# Patient Record
Sex: Female | Born: 1950 | Race: White | Hispanic: No | Marital: Married | Smoking: Former smoker
Health system: Southern US, Community
[De-identification: ages and names within clinical notes are randomized; demographics above are authoritative.]

## PROBLEM LIST (undated history)

## (undated) DIAGNOSIS — R42 Dizziness and giddiness: Secondary | ICD-10-CM

## (undated) DIAGNOSIS — R229 Localized swelling, mass and lump, unspecified: Secondary | ICD-10-CM

## (undated) DIAGNOSIS — I998 Other disorder of circulatory system: Secondary | ICD-10-CM

## (undated) DIAGNOSIS — M25512 Pain in left shoulder: Secondary | ICD-10-CM

## (undated) DIAGNOSIS — W19XXXA Unspecified fall, initial encounter: Secondary | ICD-10-CM

## (undated) DIAGNOSIS — I10 Essential (primary) hypertension: Secondary | ICD-10-CM

## (undated) DIAGNOSIS — M1990 Unspecified osteoarthritis, unspecified site: Secondary | ICD-10-CM

## (undated) DIAGNOSIS — R2689 Other abnormalities of gait and mobility: Secondary | ICD-10-CM

## (undated) DIAGNOSIS — E785 Hyperlipidemia, unspecified: Secondary | ICD-10-CM

## (undated) DIAGNOSIS — M810 Age-related osteoporosis without current pathological fracture: Secondary | ICD-10-CM

## (undated) DIAGNOSIS — K4090 Unilateral inguinal hernia, without obstruction or gangrene, not specified as recurrent: Secondary | ICD-10-CM

## (undated) DIAGNOSIS — H9190 Unspecified hearing loss, unspecified ear: Secondary | ICD-10-CM

## (undated) DIAGNOSIS — K5900 Constipation, unspecified: Secondary | ICD-10-CM

## (undated) DIAGNOSIS — I7300 Raynaud's syndrome without gangrene: Secondary | ICD-10-CM

## (undated) DIAGNOSIS — Z79899 Other long term (current) drug therapy: Secondary | ICD-10-CM

## (undated) DIAGNOSIS — M419 Scoliosis, unspecified: Secondary | ICD-10-CM

## (undated) DIAGNOSIS — G250 Essential tremor: Secondary | ICD-10-CM

## (undated) DIAGNOSIS — R232 Flushing: Secondary | ICD-10-CM

## (undated) DIAGNOSIS — Z8719 Personal history of other diseases of the digestive system: Secondary | ICD-10-CM

## (undated) DIAGNOSIS — Z7189 Other specified counseling: Secondary | ICD-10-CM

## (undated) DIAGNOSIS — Y92009 Unspecified place in unspecified non-institutional (private) residence as the place of occurrence of the external cause: Secondary | ICD-10-CM

## (undated) DIAGNOSIS — R413 Other amnesia: Secondary | ICD-10-CM

## (undated) DIAGNOSIS — M5481 Occipital neuralgia: Secondary | ICD-10-CM

## (undated) DIAGNOSIS — S99912A Unspecified injury of left ankle, initial encounter: Secondary | ICD-10-CM

## (undated) DIAGNOSIS — Z87891 Personal history of nicotine dependence: Secondary | ICD-10-CM

## (undated) DIAGNOSIS — Z0000 Encounter for general adult medical examination without abnormal findings: Secondary | ICD-10-CM

## (undated) DIAGNOSIS — M797 Fibromyalgia: Secondary | ICD-10-CM

## (undated) DIAGNOSIS — R9082 White matter disease, unspecified: Secondary | ICD-10-CM

## (undated) DIAGNOSIS — B029 Zoster without complications: Secondary | ICD-10-CM

## (undated) DIAGNOSIS — J309 Allergic rhinitis, unspecified: Secondary | ICD-10-CM

## (undated) HISTORY — DX: Other amnesia: R41.3

## (undated) HISTORY — DX: Personal history of nicotine dependence: Z87.891

## (undated) HISTORY — PX: SPINE SURGERY: SHX786

## (undated) HISTORY — DX: Essential (primary) hypertension: I10

## (undated) HISTORY — DX: Other long term (current) drug therapy: Z79.899

## (undated) HISTORY — DX: Other abnormalities of gait and mobility: R26.89

## (undated) HISTORY — DX: Unspecified fall, initial encounter: W19.XXXA

## (undated) HISTORY — DX: Other disorder of circulatory system: I99.8

## (undated) HISTORY — DX: Unspecified hearing loss, unspecified ear: H91.90

## (undated) HISTORY — DX: Pain in left shoulder: M25.512

## (undated) HISTORY — DX: Fibromyalgia: M79.7

## (undated) HISTORY — DX: Hyperlipidemia, unspecified: E78.5

## (undated) HISTORY — DX: Raynaud's syndrome without gangrene: I73.00

## (undated) HISTORY — DX: Unspecified osteoarthritis, unspecified site: M19.90

## (undated) HISTORY — DX: Essential tremor: G25.0

## (undated) HISTORY — PX: OTHER SURGICAL HISTORY: SHX169

## (undated) HISTORY — DX: Occipital neuralgia: M54.81

## (undated) HISTORY — DX: Localized swelling, mass and lump, unspecified: R22.9

## (undated) HISTORY — DX: Scoliosis, unspecified: M41.9

## (undated) HISTORY — DX: Other specified counseling: Z71.89

## (undated) HISTORY — DX: Unilateral inguinal hernia, without obstruction or gangrene, not specified as recurrent: K40.90

## (undated) HISTORY — DX: Zoster without complications: B02.9

## (undated) HISTORY — DX: Other disorder of circulatory system: R90.82

## (undated) HISTORY — DX: Allergic rhinitis, unspecified: J30.9

## (undated) HISTORY — DX: Dizziness and giddiness: R42

## (undated) HISTORY — DX: Encounter for general adult medical examination without abnormal findings: Z00.00

## (undated) HISTORY — DX: Constipation, unspecified: K59.00

## (undated) HISTORY — DX: Unspecified injury of left ankle, initial encounter: S99.912A

## (undated) HISTORY — DX: Age-related osteoporosis without current pathological fracture: M81.0

## (undated) HISTORY — PX: UMBILICAL HERNIA REPAIR: SHX196

## (undated) HISTORY — DX: Flushing: R23.2

## (undated) HISTORY — DX: Unspecified fall, initial encounter: Y92.009

## (undated) HISTORY — DX: Personal history of other diseases of the digestive system: Z87.19

---

## 2019-08-24 DIAGNOSIS — I10 Essential (primary) hypertension: Secondary | ICD-10-CM | POA: Diagnosis present

## 2020-10-19 ENCOUNTER — Encounter: Payer: Self-pay | Admitting: Neurology

## 2020-10-30 NOTE — Progress Notes (Signed)
Assessment/Plan:   1.  L sided weakness, cognitive change and falls  -Prior MRI of the brain fairly unremarkable.  Patient clearly has some abnormalities on exam (including L sided neglect), with various movements, including choreiform, tremor, and even some occasional myoclonic movements.  We will go ahead and do an MRI of the brain, this time with contrast.  -DaTscan will be completed.  No evidence of Parkinson's disease, but I do think it is possible that CBGD is in the differential diagnosis.  However, I really would expect her to be a bit more rigid.   -If above neg, we will pursue chorea labs  Subjective:   Karen Nguyen was seen today in the movement disorders clinic for neurologic consultation at the request of Verdell Face, DO.  The consultation is for the evaluation of tremor.  Medical records made available to me have been reviewed. This patient is accompanied in the office by her spouse who supplements the history. Patient has previously seen Dr. Antonietta Barcelona for this and request is for a second opinion per referring physician.  I did have the opportunity to review Dr. Larina Earthly consult in March, 2021.  There is nothing about tremor, but he did make note of gait instability and cramping.  It was felt that her left leaning/gait instability was due to her scoliosis.  Physical therapy was recommended and declined.  In addition, it was felt that she had Planter fasciitis contributing to foot pain.  Regarding her cramping, note stated that "possible underlying inherited muscle condition such as myotonic dystrophy, mitochondrial cytopathy.  We will provide a trial of tizanidine to supplement cyclobenzaprine."  I do not see that a further work-up was completed to assess the conditions in the differential.  She did, however, apparently seen neurology at [facility] in August, 2018 (just for EMG, Dr. Glenice Bow) but I do not have the results of that EMG.  I can see that she went to the appointment, but results  are not within Care Everywhere.  Tremor: Yes.     How long has it been going on?  1 year.  Started after a fall  At rest or with activation?  Both - will note that L leg "jerks" if she is sitting still but hands shake most if using them  When is it noted the most?  Writing, pouring drinks  Fam hx of tremor?  Sister shakes somes  Located where?  Bilateral upper extremities, R>L and L leg jerks  Other Specific Symptoms:  Voice: softer Sleep: sleeps better now with tizanidine but gets cramping in the feet x years  Vivid Dreams:  No.  Acting out dreams:  No. Wet Pillows: No. Postural symptoms:  Yes.    Falls?  Yes.   , Seems to fall to the left.  First fall one year ago - in chair at church and chair flipped and she hit head.  Husband states that he thought that she had a stroke after but told no after scan.  He states that L side started going "bad" after that - trouble with grasp and leaning to the left following that.  Pt fell 2 times 2 weeks ago - sat on stool and fell to the left.  With the other, she went to bend over and get some shoes and just kept going.  Husband states that she falls at least one time per month.  Doesn't use a walker.  Always leans to the left with ambulation Bradykinesia symptoms: difficulty regaining balance ; no  shuffle Loss of smell:  No. Loss of taste:  No. Urinary Incontinence:  No. Difficulty Swallowing:  No. Handwriting, micrographia: cannot answer - "I cannot do it" - b/c its shaky Trouble with ADL's:  Yes.   - b/c of dexterity.  Notes trouble getting her left foot into the shoe, stating that the left leg does not do what she wants it today.  Trouble buttoning clothing: Yes.   Memory changes:  Yes.  ; pt does finances but making mistakes - husband states that he is paying "more attention."  Pt drives.  Pts husband just retired 6 weeks ago to help take care of patient.   Hallucinations:  No.  visual distortions: Yes.   - "I do that a lot" N/V:   No. Lightheaded:  No.  Syncope: No. Diplopia:  No. , but she notes that she will look at something and the image seems to move Dyskinesia:  No.  MRI brain was completed June 24, 2019.  I do not have the films, only the report.  This was reported to show mild atrophy and mild small vessel disease.    ALLERGIES:   Allergies  Allergen Reactions  . Codeine Phosphate [Codeine] Nausea And Vomiting  . Latex Hives  . Sulfur Rash  . Lyrica Cr [Pregabalin Er]   . Oxycodone     CURRENT MEDICATIONS:  Current Outpatient Medications  Medication Instructions  . aspirin 81 MG EC tablet Oral  . Calcium Carb-Cholecalciferol (OS-CAL EXTRA D3) 500-600 MG-UNIT TABS Oral  . ELDERBERRY PO Oral  . losartan (COZAAR) 50 MG tablet 1 tablet, Oral, Daily  . magnesium hydroxide (MILK OF MAGNESIA) 400 MG/5ML suspension Oral  . ondansetron (ZOFRAN) 4 mg, Every 8 hours PRN  . pravastatin (PRAVACHOL) 20 mg, Oral, Daily, Take 1 tablet by mouth every day for cholesterol  . tiZANidine (ZANAFLEX) 2 MG tablet Take 1-2 tabs twice daily as needed for pain, insomnia.  Marland Kitchen venlafaxine XR (EFFEXOR-XR) 37.5 mg, Daily with breakfast    Objective:   PHYSICAL EXAMINATION:    VITALS:   Vitals:   10/31/20 0826  BP: 126/79  Pulse: 74  Resp: 20  SpO2: 98%  Weight: 124 lb (56.2 kg)  Height: 5\' 2"  (1.575 m)    GEN:  The patient appears stated age and is in NAD. HEENT:  Normocephalic, atraumatic.  The mucous membranes are moist. The superficial temporal arteries are without ropiness or tenderness. CV:  RRR Lungs:  CTAB Neck/HEME:  There are no carotid bruits bilaterally. Musculoskeletal: The trunk is bent left.  Neurological examination:  Orientation: The patient is alert and oriented x3.  However, has significant trouble with finer aspects of the history with some clear cognitive changes and requires some assistance from her husband.   Cranial nerves: There is good facial symmetry.  Extraocular muscles are  intact but she has significant trouble with smooth pursuit. The visual fields are full to confrontational testing. The speech is fluent and clear. Soft palate rises symmetrically and there is no tongue deviation. Hearing is intact to conversational tone. Sensation: Sensation is intact to light touch throughout (facial, trunk, extremities). Vibration is intact at the bilateral big toe. Some inconsistent extinction with double simultaneous stimulation of the left hand (but not really of the left leg). No astereognosis but she does drop the safety pin multiple times when its placed into the L hand. Motor: Strength is 5/5 in the bilateral upper and right lower extremities.   Shoulder shrug is equal and symmetric.  There is no pronator drift. Deep tendon reflexes: Deep tendon reflexes are 0-1/4 at the bilateral biceps, triceps, brachioradialis, patella and achilles. Plantar responses are downgoing bilaterally.  Movement examination: Tone: There is normal tone in the bilateral upper extremities.  The tone in the lower extremities is normal.  Abnormal movements: there is some choreiform movement of the L fingers and foot.  However, she also fidgets somewhat the right hand.  No rest tremor but some postural tremor. Coordination:  There is minimal slowness with rapid alternating movements, but no real decremation.   Gait and Station:  She pushes off to arise.  The patient is ataxic and has some astasia abasia qualities at times.  She is unsteady but does not fall.   Total time spent on today's visit was  60 minutes, including both face-to-face time and nonface-to-face time.  Time included that spent on review of records (prior notes available to me/labs/imaging if pertinent), discussing treatment and goals, answering patient's questions and coordinating care.  Cc:  Verdell Face, DO

## 2020-10-31 ENCOUNTER — Ambulatory Visit (INDEPENDENT_AMBULATORY_CARE_PROVIDER_SITE_OTHER): Payer: Medicare Other | Admitting: Neurology

## 2020-10-31 ENCOUNTER — Other Ambulatory Visit: Payer: Self-pay

## 2020-10-31 ENCOUNTER — Encounter: Payer: Self-pay | Admitting: Neurology

## 2020-10-31 VITALS — BP 126/79 | HR 74 | Resp 20 | Ht 62.0 in | Wt 124.0 lb

## 2020-10-31 DIAGNOSIS — R4189 Other symptoms and signs involving cognitive functions and awareness: Secondary | ICD-10-CM | POA: Diagnosis not present

## 2020-10-31 DIAGNOSIS — R270 Ataxia, unspecified: Secondary | ICD-10-CM | POA: Diagnosis not present

## 2020-10-31 DIAGNOSIS — G8194 Hemiplegia, unspecified affecting left nondominant side: Secondary | ICD-10-CM | POA: Diagnosis not present

## 2020-10-31 DIAGNOSIS — R251 Tremor, unspecified: Secondary | ICD-10-CM | POA: Diagnosis not present

## 2020-11-10 ENCOUNTER — Other Ambulatory Visit: Payer: Self-pay

## 2020-11-10 ENCOUNTER — Ambulatory Visit (HOSPITAL_BASED_OUTPATIENT_CLINIC_OR_DEPARTMENT_OTHER)
Admission: RE | Admit: 2020-11-10 | Discharge: 2020-11-10 | Disposition: A | Discharge status: Home / Self Care | Payer: Medicare Other | Source: Ambulatory Visit | Attending: Neurology | Admitting: Neurology

## 2020-11-10 DIAGNOSIS — R270 Ataxia, unspecified: Secondary | ICD-10-CM | POA: Insufficient documentation

## 2020-11-10 IMAGING — MR MR HEAD WO/W CM
12 series · 48 of 48 positions shown · IV contrast (gadavist)
Comparison: [DATE]

CLINICAL DATA: Ataxia

EXAM:
MRI HEAD WITHOUT AND WITH CONTRAST
TECHNIQUE: Multiplanar, multiecho pulse sequences of the brain and surrounding
structures were obtained without and with intravenous contrast.
CONTRAST:  5.5mL GADAVIST GADOBUTROL 1 MMOL/ML IV SOLN

[Series 3: T1 · sagittal · 5.0mm · 0.90mm/px · 3 of 27 slices shown (1 of 2)]
[im 1/27]
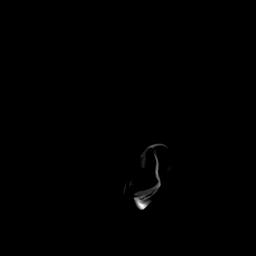
[im 14/27]
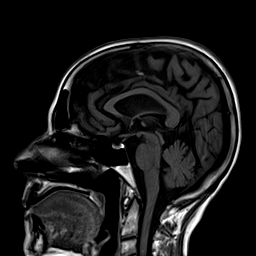
[im 27/27]
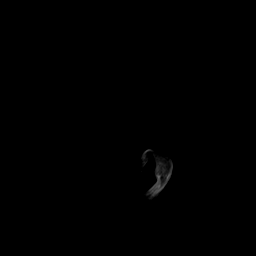

[Series 4: T2 · axial · 5.0mm · 0.69mm/px · z∈[-64,+95]mm · 2 of 28 slices shown (1 of 3)]
[im 1/28]
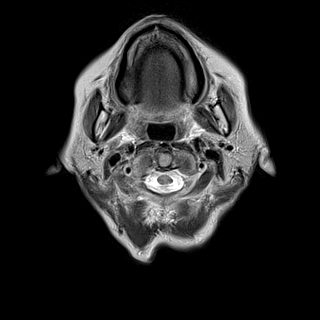
[im 28/28]
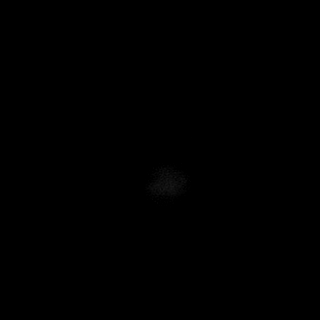

[Series 5: DWI · axial · 3.0mm · 1.86mm/px · z∈[-59,+85]mm · 7 of 90 slices shown (1 of 4)]
[im 1/90]
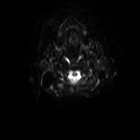
[im 15/90]
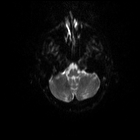
[im 30/90]
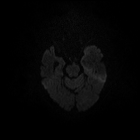
[im 45/90]
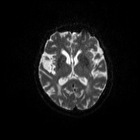
[im 60/90]
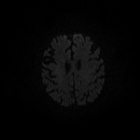
[im 75/90]
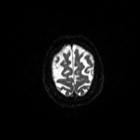
[im 90/90]
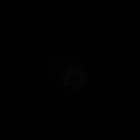

[Series 6: DWI · axial · 3.0mm · 1.86mm/px · z∈[-59,+85]mm · 3 of 45 slices shown (2 of 4)]
[im 1/45]
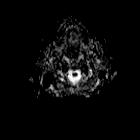
[im 23/45]
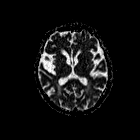
[im 45/45]
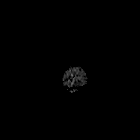

[Series 7: T2 · axial · 5.0mm · 0.43mm/px · z∈[-64,+95]mm · 2 of 28 slices shown (2 of 3)]
[im 1/28]
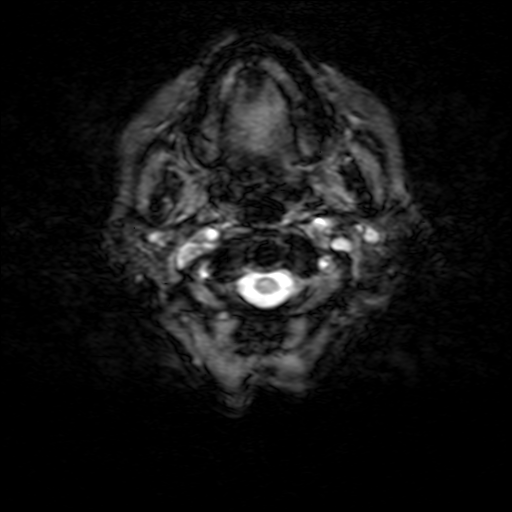
[im 28/28]
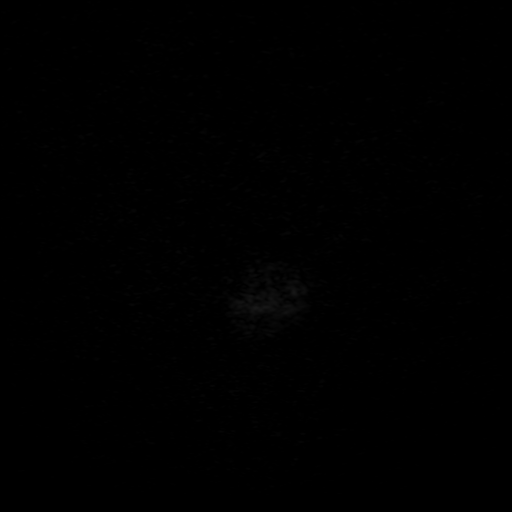

[Series 8: FLAIR · axial · 3.0mm · 0.43mm/px · z∈[-52,+94]mm · 3 of 46 slices shown]
[im 1/46]
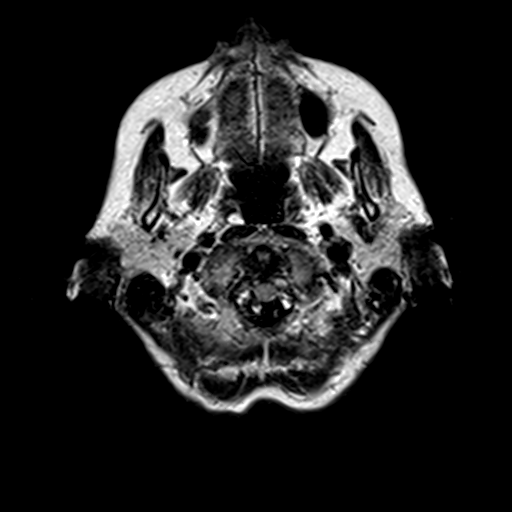
[im 23/46]
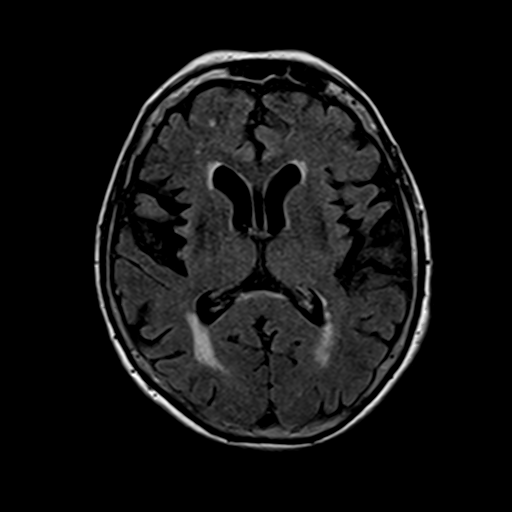
[im 46/46]
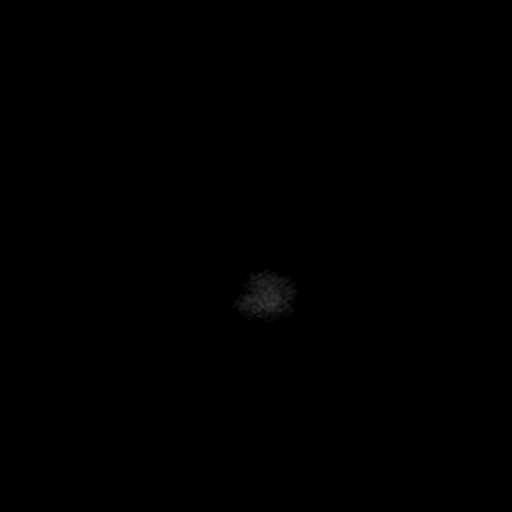

[Series 9: DWI · coronal · 3.0mm · 1.46mm/px · 7 of 90 slices shown (3 of 4)]
[im 1/90]
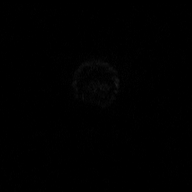
[im 15/90]
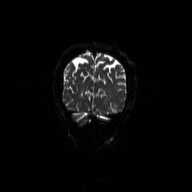
[im 30/90]
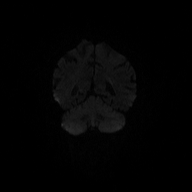
[im 45/90]
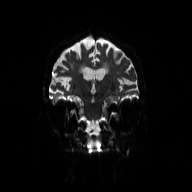
[im 60/90]
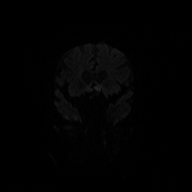
[im 75/90]
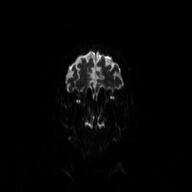
[im 90/90]
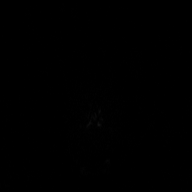

[Series 10: DWI · coronal · 3.0mm · 1.46mm/px · 3 of 45 slices shown (4 of 4)]
[im 1/45]
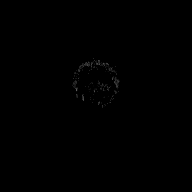
[im 23/45]
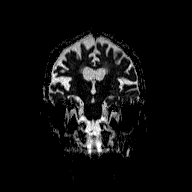
[im 45/45]
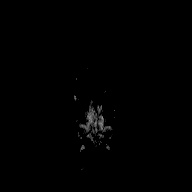

[Series 11: t1_3d_tra · axial · 2.0mm · 0.94mm/px · z∈[-59,+111]mm · 7 of 88 slices shown]
[im 1/88]
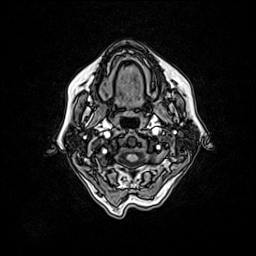
[im 15/88]
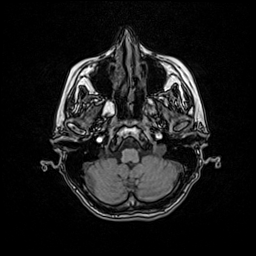
[im 30/88]
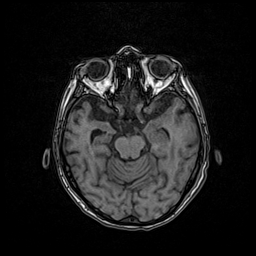
[im 44/88]
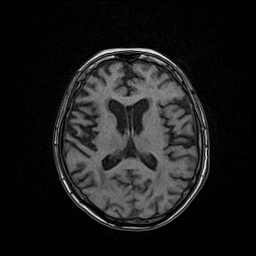
[im 59/88]
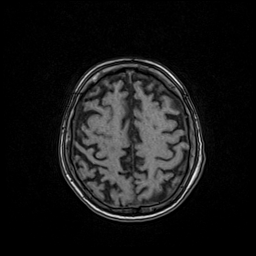
[im 73/88]
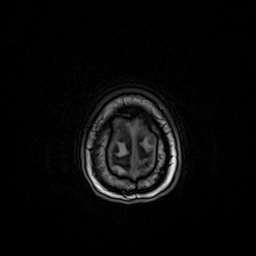
[im 88/88]
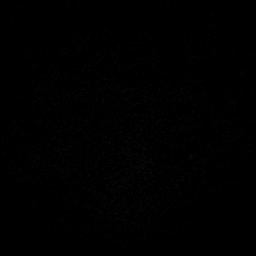

[Series 12: T2 · coronal · 5.0mm · 0.69mm/px · 2 of 28 slices shown (3 of 3)]
[im 1/28]
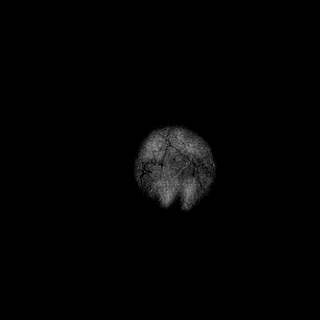
[im 28/28]
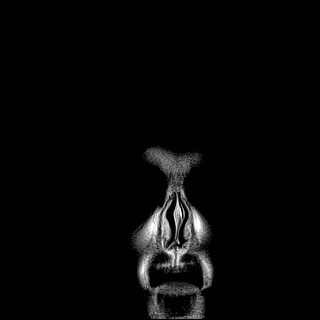

[Series 13: t1_3d_tra +c · axial · 2.0mm · 0.94mm/px · z∈[-59,+111]mm · 7 of 88 slices shown]
[im 1/88]
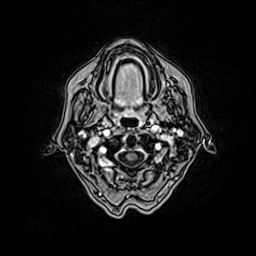
[im 15/88]
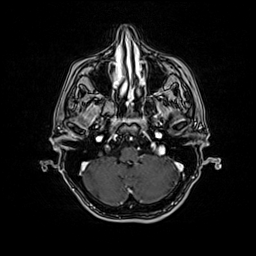
[im 30/88]
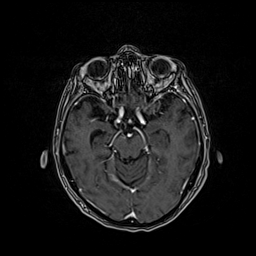
[im 44/88]
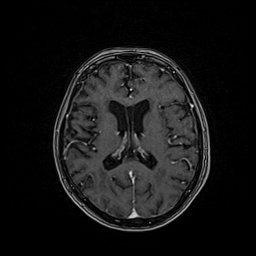
[im 59/88]
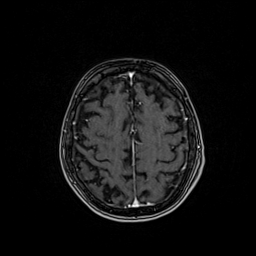
[im 73/88]
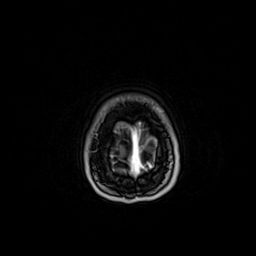
[im 88/88]
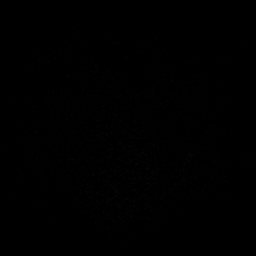

[Series 14: T1 · coronal · 5.0mm · 0.86mm/px · 2 of 28 slices shown (2 of 2)]
[im 1/28]
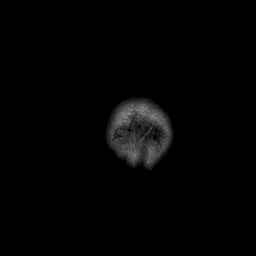
[im 28/28]
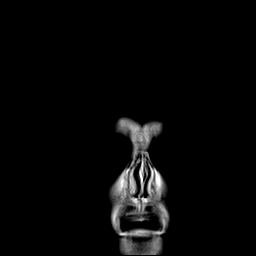

[48 of 48 positions shown; findings below may reference images not displayed]

FINDINGS: Brain: There is no acute infarction or intracranial hemorrhage.
There is no intracranial mass, mass effect, or edema. There is no
hydrocephalus or extra-axial fluid collection. Prominence of the
ventricles and sulci reflects stable parenchymal volume loss. Patchy
and confluent areas of T2 hyperintensity in the supratentorial white
matter are nonspecific but likely reflect stable mild to moderate
chronic microvascular ischemic changes. There is a small chronic
cortical/subcortical infarct of the right precentral gyrus. No
abnormal enhancement.

Vascular: Major vessel flow voids at the skull base are preserved.

Skull and upper cervical spine: Normal marrow signal is preserved.

Sinuses/Orbits: Paranasal sinuses are aerated. Orbits are
unremarkable.

Other: Sella is unremarkable.  Mastoid air cells are clear.
IMPRESSION: No evidence of recent infarction, hemorrhage, mass, or abnormal
enhancement.

Mild to moderate chronic microvascular ischemic changes. Small
chronic right frontal infarct.

## 2020-11-10 MED ORDER — GADOBUTROL 1 MMOL/ML IV SOLN
5.5000 mL | Freq: Once | INTRAVENOUS | Status: AC | PRN
  Administered 2020-11-10: 5.5 mL via INTRAVENOUS

## 2020-11-14 NOTE — Progress Notes (Signed)
No answer at 855 11/14/2020

## 2020-11-15 ENCOUNTER — Telehealth: Payer: Self-pay | Admitting: Neurology

## 2020-11-15 NOTE — Telephone Encounter (Signed)
Husband called in and would like a call back. Did not leave a detailed message.

## 2020-11-15 NOTE — Progress Notes (Signed)
Left message to call office [phone] 11/15/2020.

## 2020-11-16 ENCOUNTER — Encounter (HOSPITAL_COMMUNITY)
Admission: RE | Admit: 2020-11-16 | Discharge: 2020-11-16 | Disposition: A | Discharge status: Home / Self Care | Payer: Medicare Other | Source: Ambulatory Visit | Attending: Neurology | Admitting: Neurology

## 2020-11-16 ENCOUNTER — Other Ambulatory Visit: Payer: Self-pay

## 2020-11-16 DIAGNOSIS — R251 Tremor, unspecified: Secondary | ICD-10-CM | POA: Diagnosis not present

## 2020-11-16 IMAGING — NM NM DATSCAN
4 series · 33 of 40 positions shown · non-contrast
Comparison: Brain MRI [DATE]

CLINICAL DATA: 70-year-old female with gait disturbance. LEFT hand
and leg tremors.

EXAM:
NUCLEAR MEDICINE BRAIN IMAGING WITH SPECT  (DaTscan )
TECHNIQUE: SPECT images of the brain were obtained after intravenous injection
of radiopharmaceutical. 4 hour post injection imaging. Appropriate
positioning. 130 mg i-STAT given orally for thyroid blockade.
RADIOPHARMACEUTICALS:  4.9 millicuries I 123 Ioflupane

[Series 1: dat scan · 4.14mm/px · 5 of 120 frames shown]
[frame 11/120  full-range]
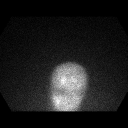
[frame 31/120  full-range]
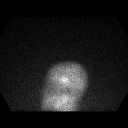
[frame 71/120  full-range]
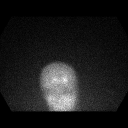
[frame 91/120  full-range]
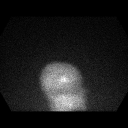
[frame 111/120  full-range]
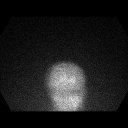

[Series 1: spect - 159 kev _(id)_cor · 4.1mm · 4.14mm/px · 5 of 128 frames shown]
[frame 11/128]
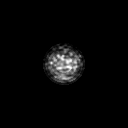
[frame 32/128]
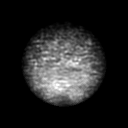
[frame 75/128]
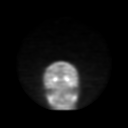
[frame 96/128]
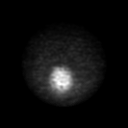
[frame 118/128]
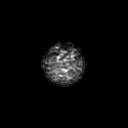

[Series 1: spect - 159 kev _(id)_tra · 4.1mm · 4.14mm/px · 5 of 128 frames shown]
[frame 11/128]
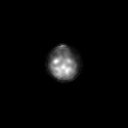
[frame 32/128]
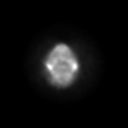
[frame 75/128]
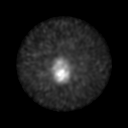
[frame 96/128]
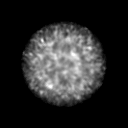
[frame 118/128]
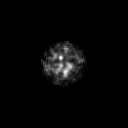

[Series 1015: mpr (id) range · 1.12mm/px · 18 of 33 slices shown]
[im 1/33]
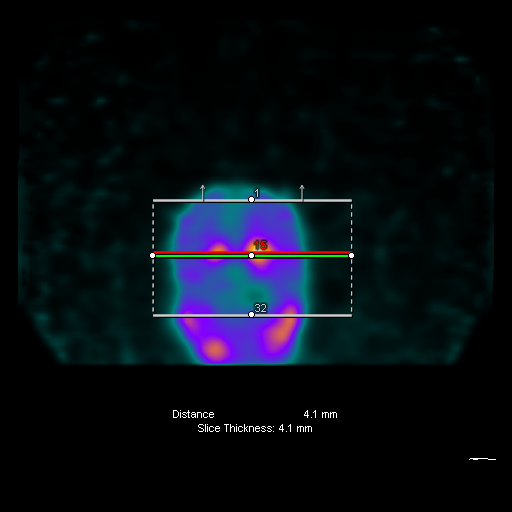
[im 4/33]
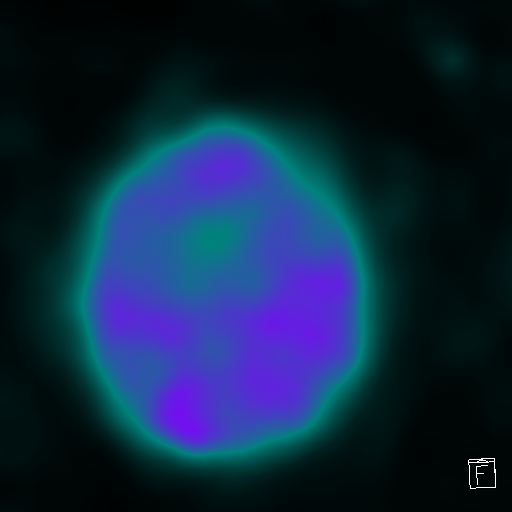
[im 5/33]
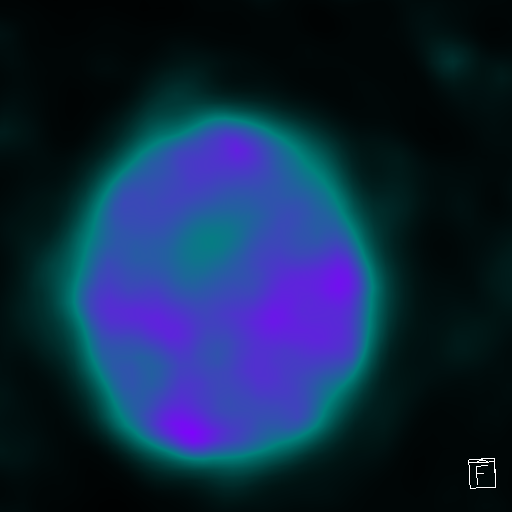
[im 7/33]
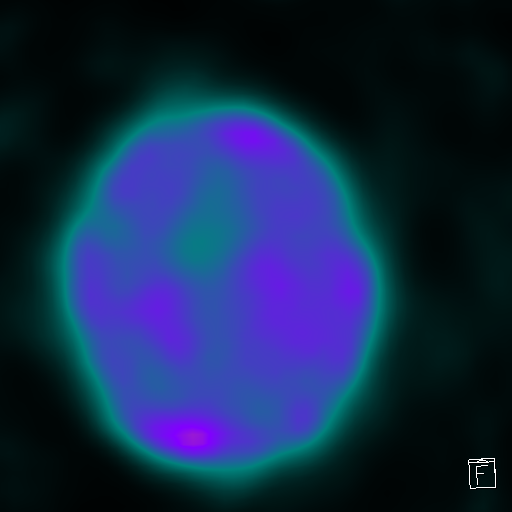
[im 8/33]
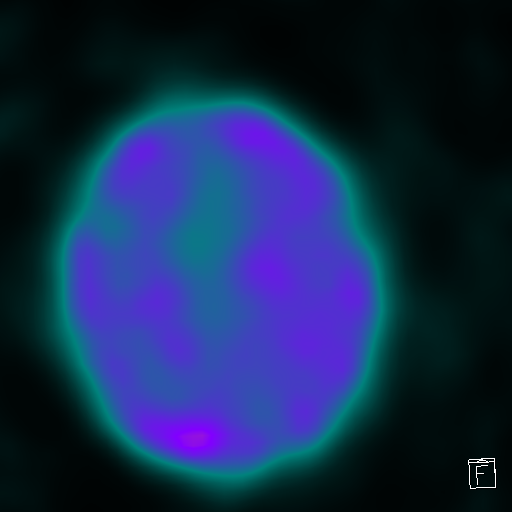
[im 10/33]
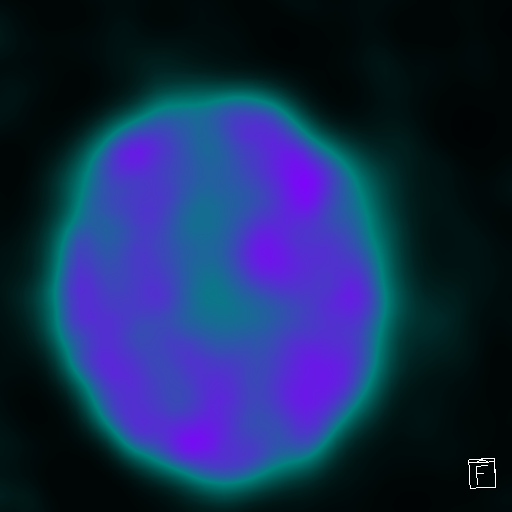
[im 13/33]
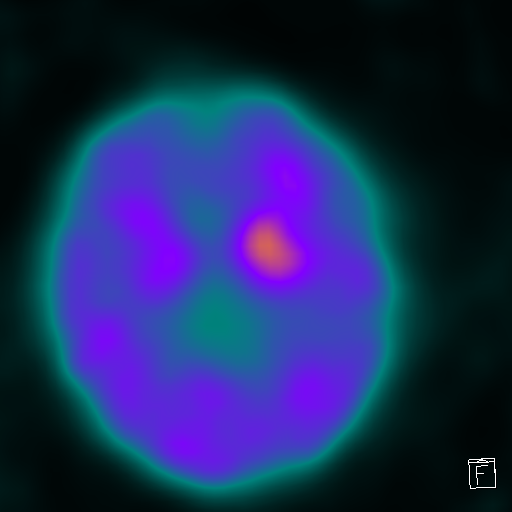
[im 14/33]
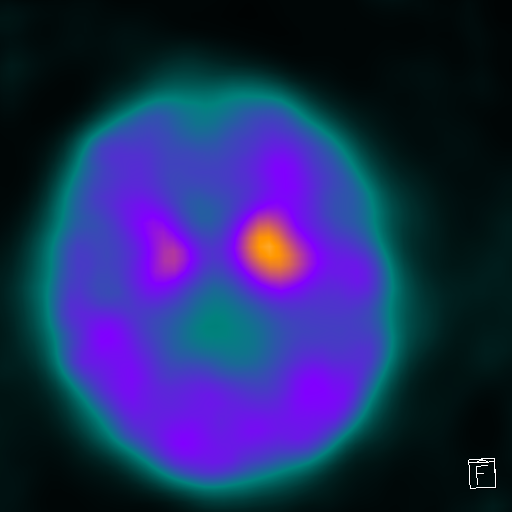
[im 16/33]
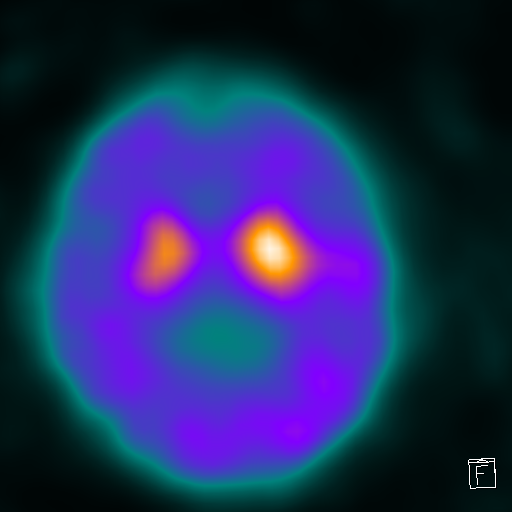
[im 17/33]
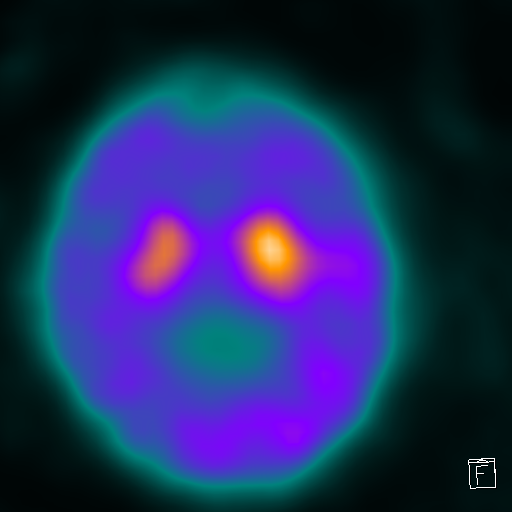
[im 19/33]
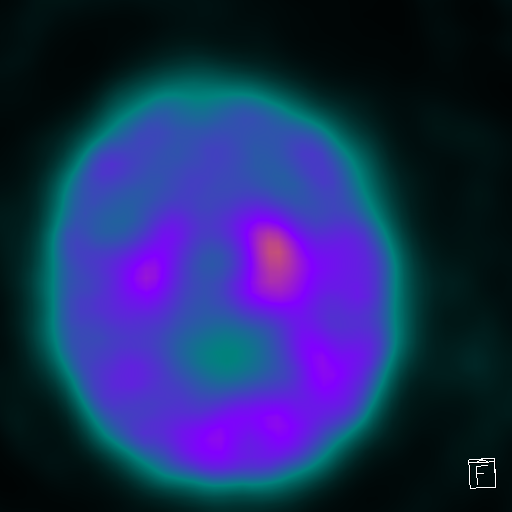
[im 22/33]
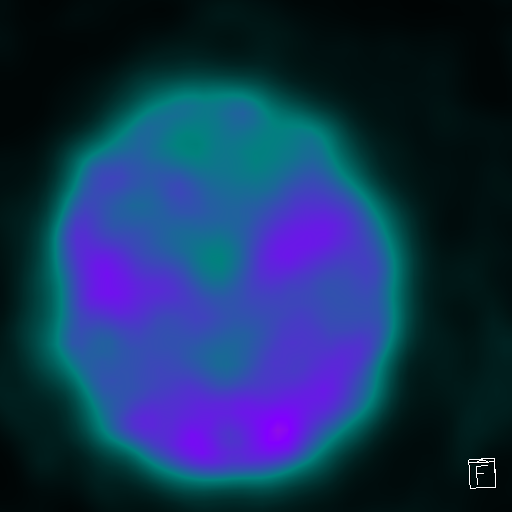
[im 23/33]
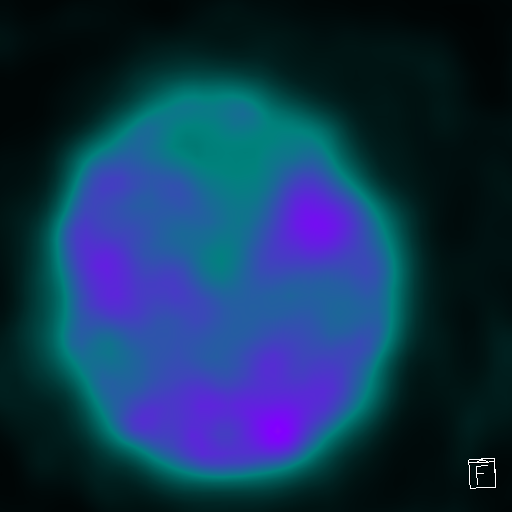
[im 25/33]
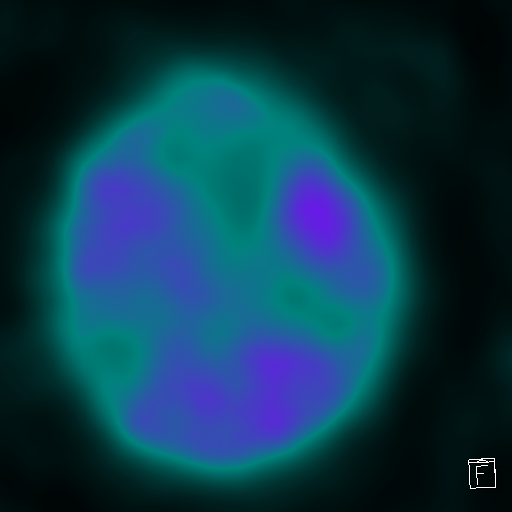
[im 26/33]
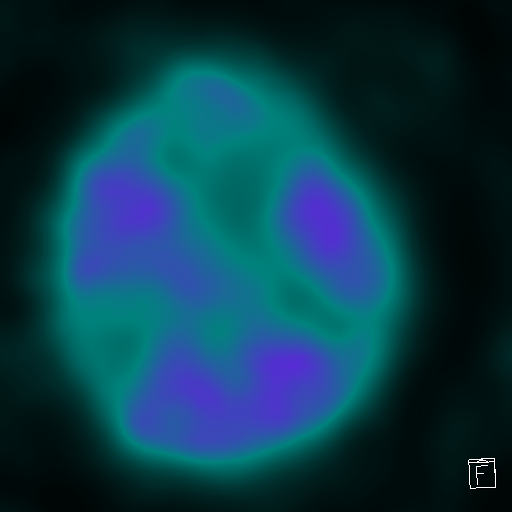
[im 28/33]
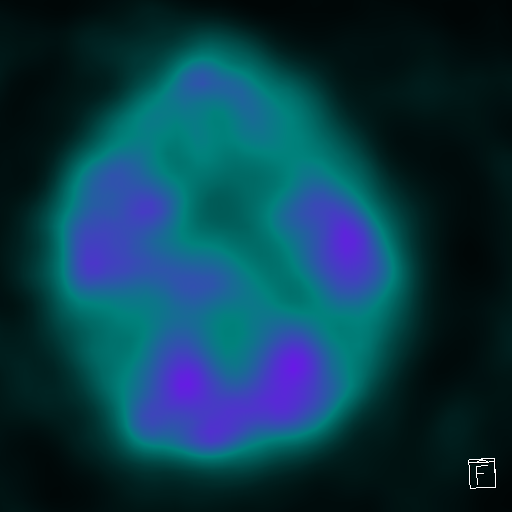
[im 31/33]
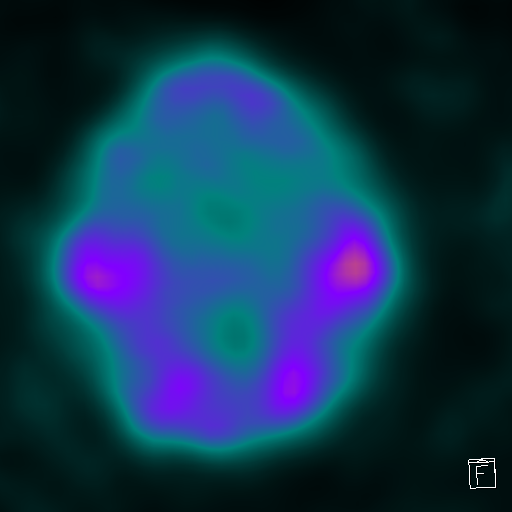
[im 33/33]
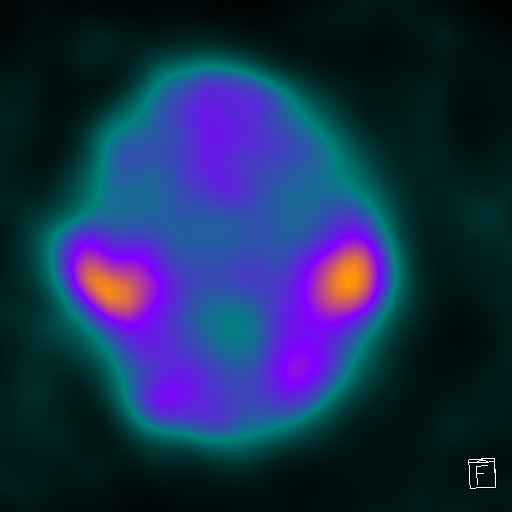

[33 of 40 positions shown; findings below may reference images not displayed]

FINDINGS: There is decreased radiotracer activity within the RIGHT striatum
compared to the LEFT. Decreased activity is in the anterior and
posterior striatum. The LEFT striatum appears normal.
IMPRESSION: Asymmetric decreased radiotracer activity in the RIGHT stratum is a
pattern which can indicate Parkinson's syndrome pathology.

Of note, DaTSCAN is not diagnostic of Parkinsonian syndromes, which
remains a clinical diagnosis. DaTscan is an adjuvant test to aid in
the clinical diagnosis of Parkinsonian syndromes.

## 2020-11-16 MED ORDER — POTASSIUM IODIDE (ANTIDOTE) 130 MG PO TABS: 130.0000 mg | ORAL_TABLET | Freq: Once | ORAL | Status: AC

## 2020-11-16 MED ORDER — POTASSIUM IODIDE (ANTIDOTE) 130 MG PO TABS
ORAL_TABLET | ORAL | Status: AC
  Administered 2020-11-16: 130 mg via ORAL
  Filled 2020-11-16: qty 1

## 2020-11-16 NOTE — Progress Notes (Signed)
Message left to call office 11/16/2020

## 2020-11-19 NOTE — Progress Notes (Signed)
Pt advised.

## 2020-11-19 NOTE — Progress Notes (Signed)
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   -DaTscan positive, with decreased radiotracer activity in the right stratum (consistent with symptoms).   -Patient with multiple types of symptoms including chorea, tremor, occasional myoclonic movements.   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  Doubt that levodopa will be of much value, but we will try it.  R/B/SE were discussed.  The opportunity to ask questions was given and they were answered to the best of my ability.  The patient expressed understanding and willingness to follow the outlined treatment protocols. -advanced directives discussed.  Has DNR.  Discussed MOST form and wants to think about it more  -met with LCSW today -worried about caregiver burden with husband.  He had to retire from trucking and he is now home full time and caregiving, both new roles for him.   2.  [place]  -I want to do HD panel to make sure we are not misisng anything.  She def has some chorea today in the R hand that I didn't see last visit.  Subjective:   Karen Nguyen was seen today in follow-up to review her DaTscan.  DaTscan was done on November 16, 2020.  I personally reviewed her DaTscan.  There was decreased radiotracer activity in the right stratum compared to that of the left.  She had an MRI of the brain on November 10, 2020.  There was mild to moderate small vessel disease.  There was a chronic right frontal infarct.  She states that she fell last wed at church.  Toe got caught on the steps - husband was able to catch her.  No diplopia.     ALLERGIES:   Allergies  Allergen Reactions   Codeine Phosphate [Codeine] Nausea And Vomiting   Latex Hives   Sulfur Rash   Lyrica Cr [Pregabalin Er]    Oxycodone     CURRENT MEDICATIONS:  Current Outpatient Medications  Medication Instructions   aspirin 81 MG EC tablet Oral   Calcium Carb-Cholecalciferol (OS-CAL EXTRA D3)  500-600 MG-UNIT TABS Oral   carbidopa-levodopa (SINEMET IR) 25-100 MG tablet 1 tablet, Oral, 3 times daily, 7am/11am/4pm   ELDERBERRY PO Oral   losartan (COZAAR) 50 MG tablet 1 tablet, Oral, Daily   pravastatin (PRAVACHOL) 20 mg, Oral, Daily, Take 1 tablet by mouth every day for cholesterol   tiZANidine (ZANAFLEX) 2 MG tablet Take 1-2 tabs twice daily as needed for pain, insomnia.    Objective:   PHYSICAL EXAMINATION:    VITALS:   Vitals:   11/20/20 0831  BP: 134/82  Pulse: (!) 109  SpO2: 91%  Weight: 123 lb (55.8 kg)  Height: '5\' 2"'  (1.575 m)     GEN:  The patient appears stated age and is in NAD. HEENT:  Normocephalic, atraumatic.  The mucous membranes are moist. The superficial temporal arteries are without ropiness or tenderness. CV:  RRR Lungs:  CTAB Neck/HEME:  There are no carotid bruits bilaterally. Musculoskeletal: The trunk is bent left.  Neurological examination:  Orientation: The patient is alert and oriented x3.  However, has significant trouble with finer aspects of the history with some clear cognitive changes and requires some assistance from her husband.   Cranial nerves: There is good facial symmetry.  Extraocular muscles are intact but she has significant trouble with smooth pursuit. The visual fields are full to confrontational testing. The speech is fluent and clear. Soft palate rises  symmetrically and there is no tongue deviation. Hearing is intact to conversational tone. Motor: Strength is 5/5 in the bilateral upper and right lower extremities.   Shoulder shrug is equal and symmetric.  There is no pronator drift.  Movement examination: Tone: There is normal tone in the bilateral upper extremities.  The tone in the lower extremities is normal.  Abnormal movements: there is some choreiform movement of the L fingers and foot.  Has some chorea of the R hand today as well.   Coordination:  There is minimal slowness with rapid alternating movements, but no real  decremation.   Gait and Station:  She pushes off to arise.  The patient is ataxic and has some astasia abasia qualities at times.  She is unsteady but does not fall.   Total time spent on today's visit was  45 minutes, including both face-to-face time and nonface-to-face time.  Time included that spent on review of records (prior notes available to me/labs/imaging if pertinent), discussing treatment and goals, answering patient's questions and coordinating care.  Cc:  [name], DO

## 2020-11-19 NOTE — Progress Notes (Signed)
Message left to call office x 2

## 2020-11-20 ENCOUNTER — Ambulatory Visit (INDEPENDENT_AMBULATORY_CARE_PROVIDER_SITE_OTHER): Payer: Medicare Other | Admitting: Neurology

## 2020-11-20 ENCOUNTER — Other Ambulatory Visit: Payer: Self-pay

## 2020-11-20 ENCOUNTER — Encounter: Payer: Self-pay | Admitting: Neurology

## 2020-11-20 VITALS — BP 134/82 | HR 109 | Ht 62.0 in | Wt 123.0 lb

## 2020-11-20 DIAGNOSIS — G3185 Corticobasal degeneration: Secondary | ICD-10-CM

## 2020-11-20 DIAGNOSIS — G255 Other chorea: Secondary | ICD-10-CM | POA: Diagnosis not present

## 2020-11-20 MED ORDER — CARBIDOPA-LEVODOPA 25-100 MG PO TABS: 1.0000 | ORAL_TABLET | Freq: Three times a day (TID) | ORAL | 1 refills | Status: DC

## 2020-11-20 NOTE — Patient Instructions (Signed)
Start Carbidopa Levodopa as follows: Take 1/2 tablet three times daily, at least 30 minutes before meals (approximately 7am/11am/4pm), for one week Then take 1/2 tablet in the morning, 1/2 tablet in the afternoon, 1 tablet in the evening, at least 30 minutes before meals, for one week Then take 1/2 tablet in the morning, 1 tablet in the afternoon, 1 tablet in the evening, at least 30 minutes before meals, for one week Then take 1 tablet three times daily at 7am/11am/4pm, at least 30 minutes before meals   As a reminder, carbidopa/levodopa can be taken at the same time as a carbohydrate, but we like to have you take your pill either 30 minutes before a protein source or 1 hour after as protein can interfere with carbidopa/levodopa absorption.  

## 2021-01-17 ENCOUNTER — Telehealth: Payer: Self-pay | Admitting: Neurology

## 2021-01-17 NOTE — Telephone Encounter (Signed)
Pt called in wanting to know if we ordered blood work for her? She stated someone called her this morning stating they were sending her something in the mail and they would be out to her home Monday to take her blood. She asked the lady what it was for, but they told her they did not know who ordered it.

## 2021-01-18 NOTE — Telephone Encounter (Signed)
Called patient twice and husband picked up and could not hear me both times. Just kept saying "hello."

## 2021-02-07 ENCOUNTER — Telehealth: Payer: Self-pay | Admitting: Neurology

## 2021-02-07 NOTE — Telephone Encounter (Signed)
Pts genetic testing for [place] disease was neg.  Let pt/family know and can talk at next visit about details.  She has appt coming up

## 2021-02-07 NOTE — Telephone Encounter (Signed)
Called patient with test results very happy with the negative result for [place]. Husband also mentioned the medication prescribed has really been helping her a lot Carbidopa Levodopa

## 2021-02-21 NOTE — Progress Notes (Signed)
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   -DaTscan positive, with decreased radiotracer activity in the right stratum (consistent with symptoms).   -Patient with multiple types of symptoms including chorea, tremor, occasional myoclonic movements.  That is certainly what points to the diagnosis of CBGD, but she is not completely asymmetric, which points away from the diagnosis.  Discussed this with patient and daughter today. -Genetic testing for HD negative. -worried about caregiver burden with husband.  He had to retire from trucking and he is now home full time and caregiving, both new roles for him.  Discussed again, with daughter present, the importance of family input and respite care.  Daughter expresses concern that the patient is trying to take care of her brother.  Had my LCSW meet with them today. -discussed importance of safe exercise -discussed driving test.  For now, her husband thinks that she is safe to drive in very local areas.  She does not drive on the highway  -Talked with her about the importance of safe, cardiovascular exercise.  Community resources for this are provided.  She lives in [place], which [place] [employer] [place], so we discussed this extensively.  -For now, she will continue with carbidopa/levodopa 25/100, 1 tablet 3 times per day.  May add a CR at night for cramping in the future, although she states that cramping has been lifelong.  Subjective:   Karen Nguyen was seen today in follow-up.  Pt with husband and daughter who supplements the history.   She had genetic testing since our last visit due to choreiform movements.  Genetic testing for HD was negative.  We started levodopa to see if that would be of any value.  They report that "its doing good!"  Reports dizziness is  better and no falls.  Does have constipation.  Current movement disorder medications: Carbidopa/levodopa 25/100, 1 tablet 3 times daily.   ALLERGIES:   Allergies  Allergen Reactions   Codeine Phosphate [Codeine] Nausea And Vomiting   Latex Hives   Sulfur Rash   Lyrica Cr [Pregabalin Er]    Oxycodone     CURRENT MEDICATIONS:  Current Outpatient Medications  Medication Instructions   aspirin 81 MG EC tablet Oral   Calcium Carb-Cholecalciferol (OS-CAL EXTRA D3) 500-600 MG-UNIT TABS Oral   carbidopa-levodopa (SINEMET IR) 25-100 MG tablet 1 tablet, Oral, 3 times daily, 7am/11am/4pm   ELDERBERRY PO Oral   losartan (COZAAR) 50 MG tablet 1 tablet, Oral, Daily   pravastatin (PRAVACHOL) 20 mg, Oral, Daily, Take 1 tablet by mouth every day for cholesterol   tiZANidine (ZANAFLEX) 2 MG tablet Take 1-2 tabs twice daily as needed for pain, insomnia.    Objective:   PHYSICAL EXAMINATION:    VITALS:   Vitals:   02/25/21 1354  BP: 121/64  Weight: 128 lb 6.4 oz (58.2 kg)  Height: 5\' 2"  (1.575 m)      GEN:  The patient appears stated age and is in NAD. HEENT:  Normocephalic, atraumatic.  The mucous membranes are moist. The superficial temporal arteries are without ropiness or tenderness. CV:  RRR Lungs:  CTAB Neck/HEME:  There are no carotid bruits bilaterally. Musculoskeletal: The trunk is bent left.  Neurological examination:  Orientation: The patient is alert and oriented x3.  However, has significant trouble with finer aspects of the  history with some clear cognitive changes and requires some assistance from her husband.   Cranial nerves: There is good facial symmetry.  Extraocular muscles are intact but she has significant trouble with smooth pursuit. The visual fields are full to confrontational testing. The speech is fluent and clear. Soft palate rises symmetrically and there is no tongue deviation. Hearing is intact to conversational tone. Motor: Strength is 5/5 in the  bilateral upper and right lower extremities.   Shoulder shrug is equal and symmetric.  There is no pronator drift.  Movement examination: Tone: There is normal tone in the bilateral upper extremities.  The tone in the lower extremities is normal.  Abnormal movements: chorea of the R hand only  Coordination:  There is minimal slowness with rapid alternating movements, but no real decremation.   Gait and Station: Not tested today.   Total time spent on today's visit was  41 minutes, including both face-to-face time and nonface-to-face time.  Time included that spent on review of records (prior notes available to me/labs/imaging if pertinent), discussing treatment and goals, answering patient's questions and coordinating care.  Cc:  Verdell Face, DO

## 2021-02-25 ENCOUNTER — Ambulatory Visit (INDEPENDENT_AMBULATORY_CARE_PROVIDER_SITE_OTHER): Payer: Medicare Other | Admitting: Neurology

## 2021-02-25 ENCOUNTER — Other Ambulatory Visit: Payer: Self-pay

## 2021-02-25 VITALS — BP 121/64 | Ht 62.0 in | Wt 128.4 lb

## 2021-02-25 DIAGNOSIS — G255 Other chorea: Secondary | ICD-10-CM | POA: Diagnosis not present

## 2021-02-25 DIAGNOSIS — G3185 Corticobasal degeneration: Secondary | ICD-10-CM | POA: Diagnosis not present

## 2021-02-25 MED ORDER — CARBIDOPA-LEVODOPA 25-100 MG PO TABS: 1.0000 | ORAL_TABLET | Freq: Three times a day (TID) | ORAL | 1 refills | Status: DC

## 2021-05-15 ENCOUNTER — Telehealth: Payer: Self-pay | Admitting: Neurology

## 2021-05-15 NOTE — Telephone Encounter (Signed)
Called husband to check on patient. Let husband know that Dr. Arbutus Leas agreed with my recommendation he take patient to Urgent Care or PCP. Patient has been resting and he didn't want to wake her he is going to wake her and get her assessed I told patients husband I would call to check on her in the morning

## 2021-05-15 NOTE — Telephone Encounter (Signed)
Called patients husband to get more information about the patient. I did screen patient using our stroke symptoms Balance. Patient does not appear to be having a stroke but husband said she is weak didn't want to go to physical therapy today that she loves going to. Symptoms began last night. Weak tired not focused. I reccommended the husband take patient to Urgent Care but will also send to Dr. Arbutus Leas for recommendations

## 2021-05-15 NOTE — Telephone Encounter (Signed)
Patient husband called in, said [place] isnt acting right today. Very weak,legs feel heavy, and she has been sliding back some.

## 2021-08-27 NOTE — Progress Notes (Signed)
? ?Assessment/Plan:  ? ?1.  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 -Discussed only  time will help [place] to differentiate this from other parkinsonism diagnoses. ? 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   ?-DaTscan positive, with decreased radiotracer activity in the right stratum (consistent with symptoms).   ?-Patient with multiple types of symptoms including chorea, tremor, occasional myoclonic movements.  That is certainly what points to the diagnosis of CBGD, but she is not completely asymmetric, which points away from the diagnosis.  Discussed this with patient and daughter today. ?-Genetic testing for HD negative. ?-worried about caregiver burden with husband. Discussed that again today.   ?-discussed importance of safe exercise ? 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  ?-For now, she will increase carbidopa/levodopa 25/100, 1.5 tablet 3 times per day.  May need to increase this further in future. ?-add carbidopa/levodopa 50/200 CR at bed.  Hopefully this will help cramping at night. ?-walker at all times.  ? -we will do MRI cervical spine just to make sure we are not missing anything with the poor dexterity of the left hand, but I think this is all related to the above. ? ?Subjective:  ? ?Karen Nguyen was seen today in follow-up.  Pt with husband and daughter who supplements the history.  She has fallen twice.  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  With the one fall, she was at [facility], her toes curled under her and she went to get up and fell.  She has had no lightheadedness or near syncope.  She continues on levodopa.  She has no hallucinations.  She is doing rs[employer] and loves it.  Having cramping in feet, mostly at night, and may travel into the groin.   ? ?Current movement disorder medications: ?Carbidopa/levodopa 25/100, 1 tablet 3 times daily. ? ? ?ALLERGIES:   ?Allergies   ?Allergen Reactions  ? Codeine Phosphate [Codeine] Nausea And Vomiting  ? Latex Hives  ? Sulfur Rash  ? Lyrica Cr [Pregabalin Er]   ? Oxycodone   ? ? ?CURRENT MEDICATIONS:  ?Current Outpatient Medications  ?Medication Instructions  ? aspirin 81 MG EC tablet Oral  ? Calcium Carb-Cholecalciferol (OS-CAL EXTRA D3) 500-600 MG-UNIT TABS Oral  ? carbidopa-levodopa (SINEMET IR) 25-100 MG tablet 1 tablet, Oral, 3 times daily, 7am/11am/4pm  ? ELDERBERRY PO Oral  ? ibuprofen (ADVIL) 200 mg, Oral, Every 4 hours PRN  ? losartan (COZAAR) 50 MG tablet 1 tablet, Oral, Daily  ? pravastatin (PRAVACHOL) 20 mg, Oral, Daily, Take 1 tablet by mouth every day for cholesterol  ? tiZANidine (ZANAFLEX) 2 MG tablet Take 1-2 tabs twice daily as needed for pain, insomnia.  ? ? ?Objective:  ? ?PHYSICAL EXAMINATION:   ? ?VITALS:   ?Vitals:  ? 08/29/21 1538  ?BP: 111/60  ?Pulse: 78  ?SpO2: 97%  ?Weight: 131 lb 3.2 oz (59.5 kg)  ?Height: 5\' 3"  (1.6 m)  ? ? ? ? ? ?GEN:  The patient appears stated age and is in NAD. ?HEENT:  Normocephalic, atraumatic.  The mucous membranes are moist. The superficial temporal arteries are without ropiness or tenderness. ?CV:  RRR ?Lungs:  CTAB ?Neck/HEME:  There are no carotid bruits bilaterally. ?Musculoskeletal: The trunk is bent left. ? ?Neurological examination: ? ?Orientation: The patient is alert and oriented x3.  However, has significant trouble with finer aspects of the history  with some clear cognitive changes and requires some assistance from her husband.   ?Cranial nerves: There is good facial symmetry.  Extraocular muscles are intact but she has significant trouble with smooth pursuit. The visual fields are full to confrontational testing. The speech is fluent and clear. Soft palate rises symmetrically and there is no tongue deviation. Hearing is intact to conversational tone. ?Motor: Strength is 5/5 in the bilateral upper and right lower extremities.   Shoulder shrug is equal and symmetric.  There is no  pronator drift. ? ?Movement examination: ?Tone: There is normal tone in the bilateral upper extremities.  The tone in the lower extremities is normal.  ?Abnormal movements: Minimal choreiform movements today. ?Coordination:  There is significant slowness on the L.   ?Gait and Station: She is unbalanced and ataxic and slow. ? ? ?Total time spent on today's visit was  55 minutes, including both face-to-face time and nonface-to-face time.  Time included that spent on review of records (prior notes available to me/labs/imaging if pertinent), discussing treatment and goals, answering patient's questions and coordinating care. ? ?Cc:  [name], DO ? ?

## 2021-08-29 ENCOUNTER — Encounter: Payer: Self-pay | Admitting: Neurology

## 2021-08-29 ENCOUNTER — Ambulatory Visit (INDEPENDENT_AMBULATORY_CARE_PROVIDER_SITE_OTHER): Payer: Medicare Other | Admitting: Neurology

## 2021-08-29 VITALS — BP 111/60 | HR 78 | Ht 63.0 in | Wt 131.2 lb

## 2021-08-29 DIAGNOSIS — G3185 Corticobasal degeneration: Secondary | ICD-10-CM

## 2021-08-29 DIAGNOSIS — M542 Cervicalgia: Secondary | ICD-10-CM | POA: Diagnosis not present

## 2021-08-29 MED ORDER — CARBIDOPA-LEVODOPA 25-100 MG PO TABS: ORAL_TABLET | ORAL | 1 refills | Status: DC

## 2021-08-29 MED ORDER — CARBIDOPA-LEVODOPA ER 50-200 MG PO TBCR: 1.0000 | EXTENDED_RELEASE_TABLET | Freq: Every day | ORAL | 1 refills | Status: DC

## 2021-08-29 MED ORDER — AMBULATORY NON FORMULARY MEDICATION: 0 refills | Status: DC

## 2021-08-29 NOTE — Patient Instructions (Addendum)
Take carbidopa/levodopa 25/100, 1.5 tablets at 7am/11am/4pm ?ADD carbidopa/levodopa 50/200 at bedtime ?Use walker at all times! ?We will send a referral for PT ?We will do MRI cervical spine ? ? ?The physicians and staff at [facility] are committed to providing excellent care. You may receive a survey requesting feedback about your experience at our office. We strive to receive "very good" responses to the survey questions. If you feel that your experience would prevent you from giving the office a "very good " response, please contact our office to try to remedy the situation. We may be reached at [phone]. Thank you for taking the time out of your busy day to complete the survey. ? ?

## 2021-08-30 ENCOUNTER — Other Ambulatory Visit: Payer: Self-pay

## 2021-08-30 DIAGNOSIS — G3185 Corticobasal degeneration: Secondary | ICD-10-CM

## 2021-08-30 DIAGNOSIS — R270 Ataxia, unspecified: Secondary | ICD-10-CM

## 2021-08-30 DIAGNOSIS — R251 Tremor, unspecified: Secondary | ICD-10-CM

## 2021-08-30 DIAGNOSIS — G255 Other chorea: Secondary | ICD-10-CM

## 2021-08-30 MED ORDER — CARBIDOPA-LEVODOPA 25-100 MG PO TABS: ORAL_TABLET | ORAL | 1 refills | Status: DC

## 2021-09-02 ENCOUNTER — Telehealth (HOSPITAL_BASED_OUTPATIENT_CLINIC_OR_DEPARTMENT_OTHER): Payer: Self-pay

## 2021-09-02 ENCOUNTER — Telehealth: Payer: Self-pay | Admitting: Neurology

## 2021-09-02 NOTE — Telephone Encounter (Signed)
Called patients daughter back and her mom is having some real issues with using her Walker. She is blaming the family and having a very bad attitude . 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 She wants to drive and family willing to let her. Patient will not attend PT even though they have called twice to schedule. Patients daughter looking for guidance as they are at the end of the rope. She called her best friend who came and picked her up today and family asked her to take walker and she carried it out to the car not using it. Pateint daughter wanting to speak to [facility] as well ?

## 2021-09-02 NOTE — Telephone Encounter (Signed)
Patients daughter called and stated she would like a return phone call about her mother. ?

## 2021-09-02 NOTE — Telephone Encounter (Signed)
Patient's daughter called again.

## 2021-09-07 ENCOUNTER — Ambulatory Visit (HOSPITAL_BASED_OUTPATIENT_CLINIC_OR_DEPARTMENT_OTHER)
Admission: RE | Admit: 2021-09-07 | Discharge: 2021-09-07 | Disposition: A | Discharge status: Home / Self Care | Payer: Medicare Other | Source: Ambulatory Visit | Attending: Neurology | Admitting: Neurology

## 2021-09-07 DIAGNOSIS — M542 Cervicalgia: Secondary | ICD-10-CM | POA: Diagnosis not present

## 2021-09-07 IMAGING — MR MR CERVICAL SPINE W/O CM
5 of 6 series · 33 of 48 positions shown · non-contrast
Comparison: None.

CLINICAL DATA: Neck pain, acute.  No red flag symptoms.

EXAM:
MRI CERVICAL SPINE WITHOUT CONTRAST
TECHNIQUE: Multiplanar, multisequence MR imaging of the cervical spine was
performed. No intravenous contrast was administered.

[Series 5: T2 · sagittal · 3.0mm · 0.56mm/px · 6 of 15 slices shown (1 of 3)]
[im 1/15]
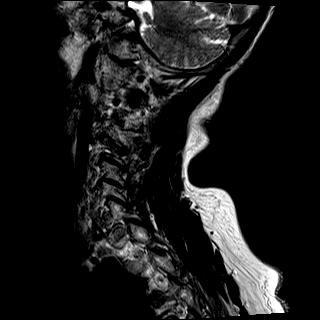
[im 3/15]
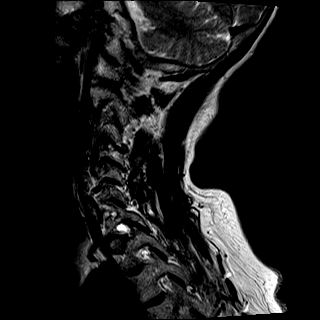
[im 6/15]
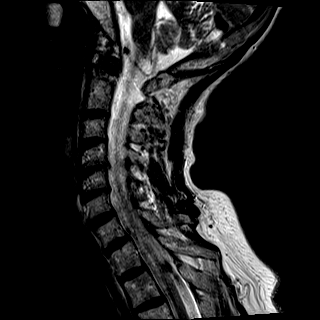
[im 9/15]
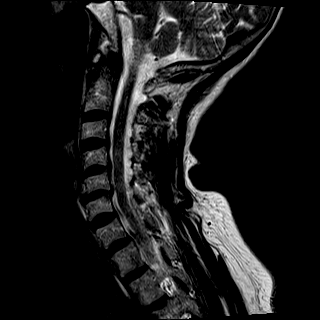
[im 12/15]
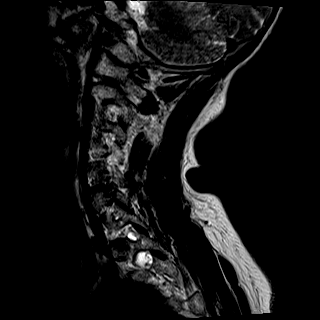
[im 15/15]
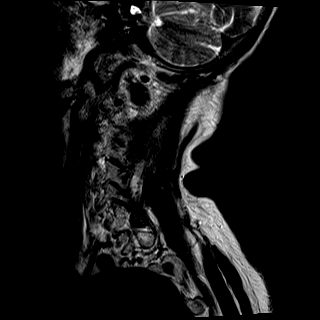

[Series 6: T1 · sagittal · 3.0mm · 0.56mm/px · 5 of 15 slices shown]
[im 1/15]
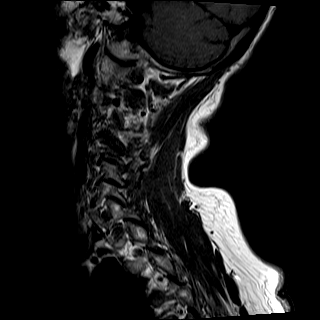
[im 4/15]
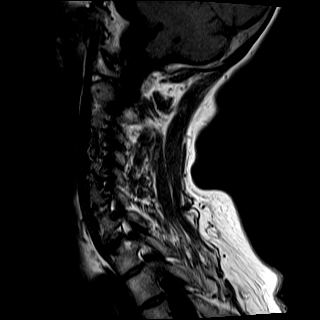
[im 8/15]
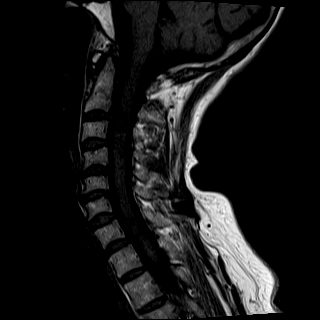
[im 11/15]
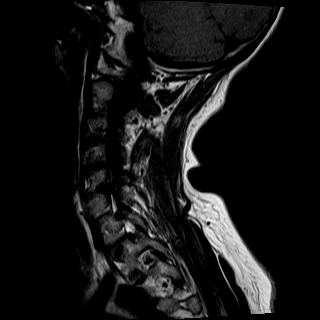
[im 15/15]
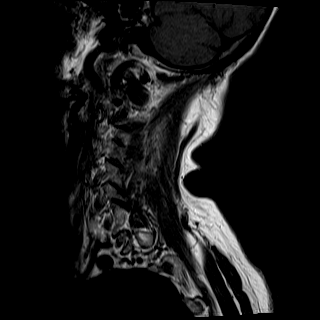

[Series 7: STIR · sagittal · 3.0mm · 0.56mm/px · 4 of 15 slices shown]
[im 1/15]
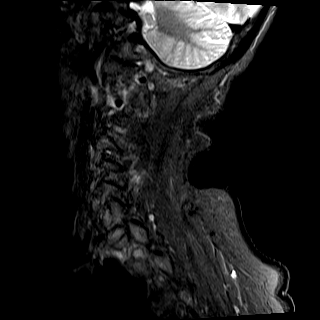
[im 4/15]
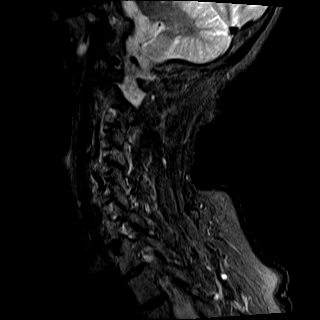
[im 8/15]
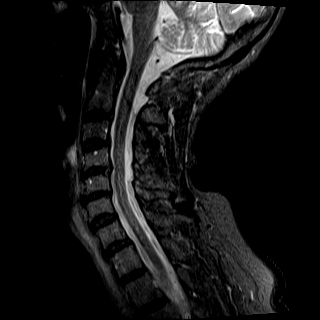
[im 11/15]
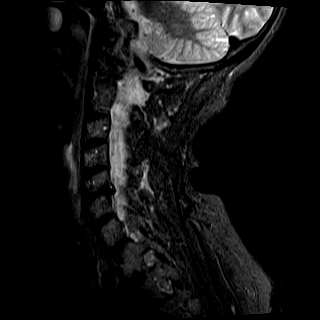

[Series 8: T2 · axial · 3.0mm · 0.59mm/px · z∈[-86,+32]mm · 9 of 36 slices shown (2 of 3)]
[im 1/36]
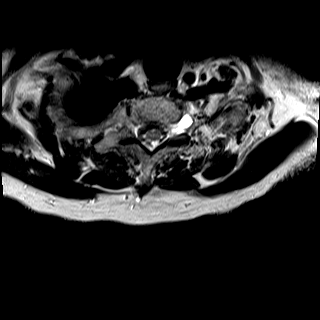
[im 7/36]
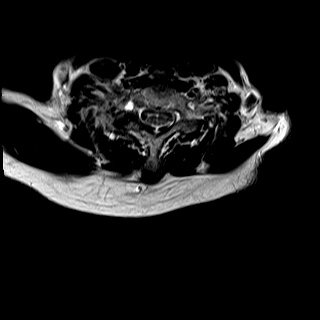
[im 10/36]
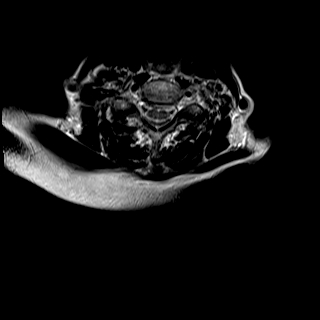
[im 16/36]
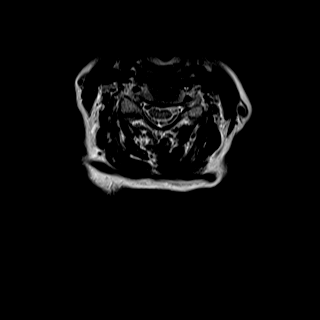
[im 20/36]
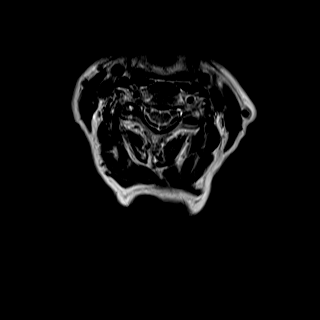
[im 26/36]
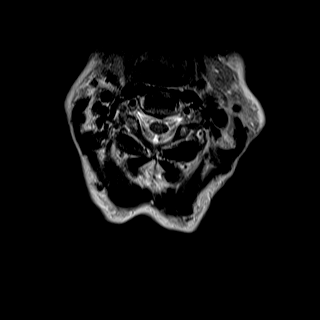
[im 29/36]
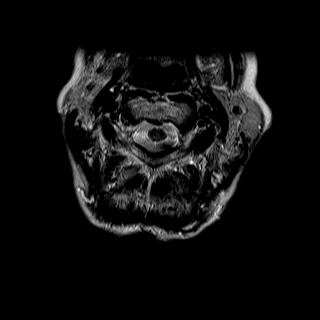
[im 32/36]
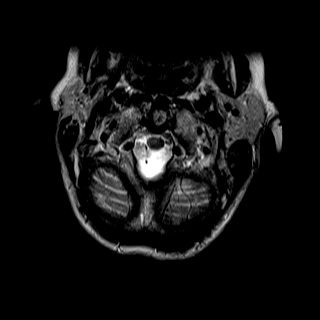
[im 36/36]
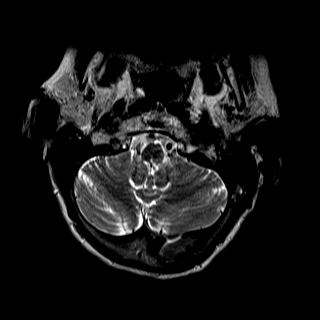

[Series 9: T2 · axial · 3.0mm · 0.38mm/px · z∈[-86,+31]mm · 9 of 36 slices shown (3 of 3)]
[im 1/36]
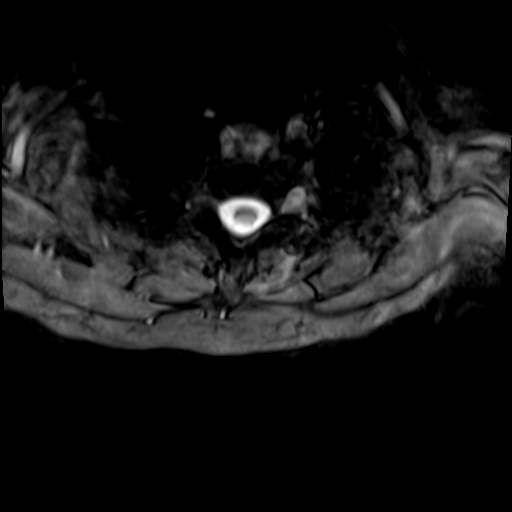
[im 7/36]
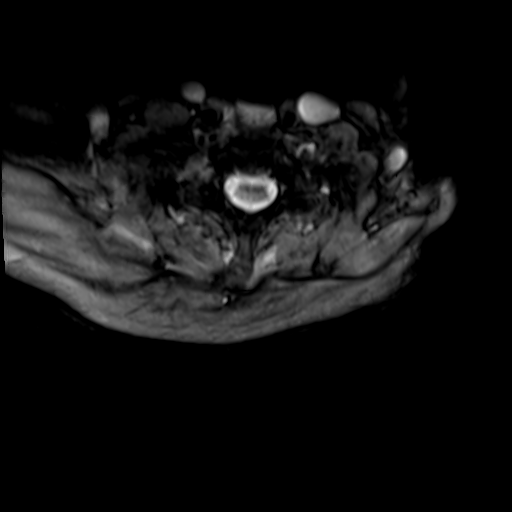
[im 10/36]
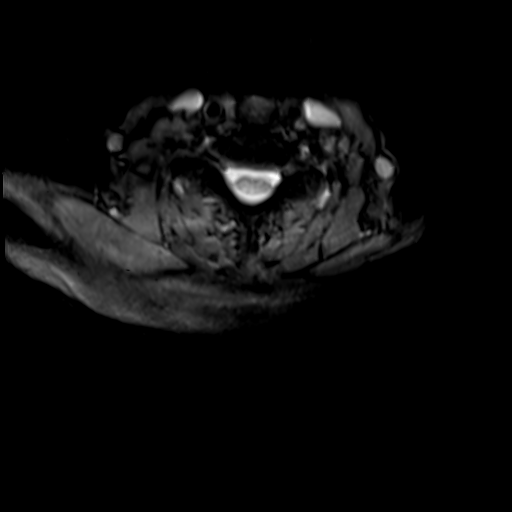
[im 16/36]
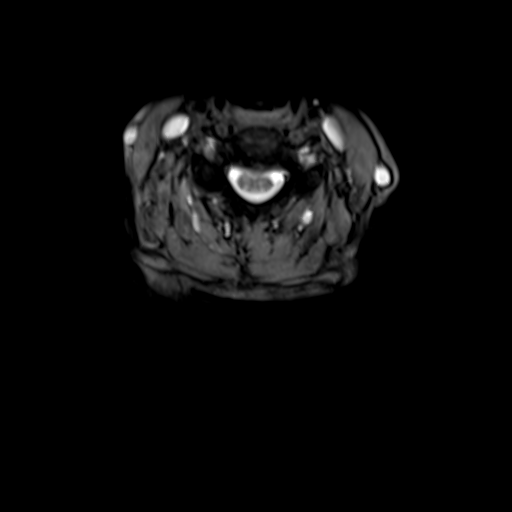
[im 20/36]
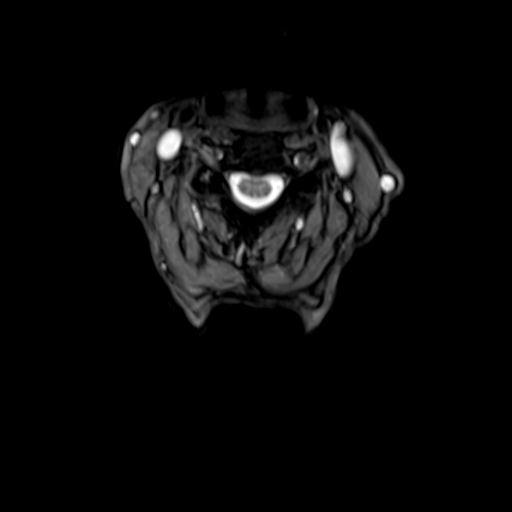
[im 26/36]
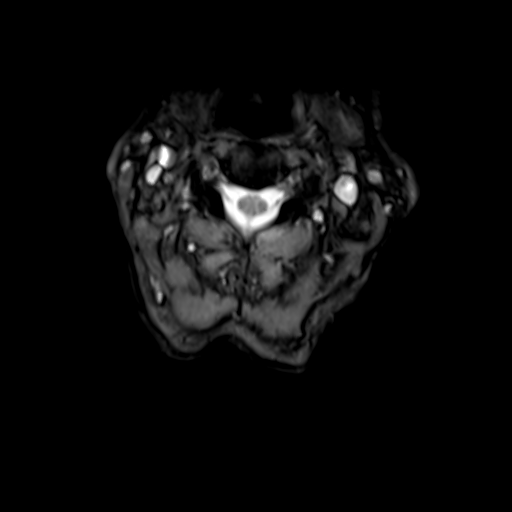
[im 29/36]
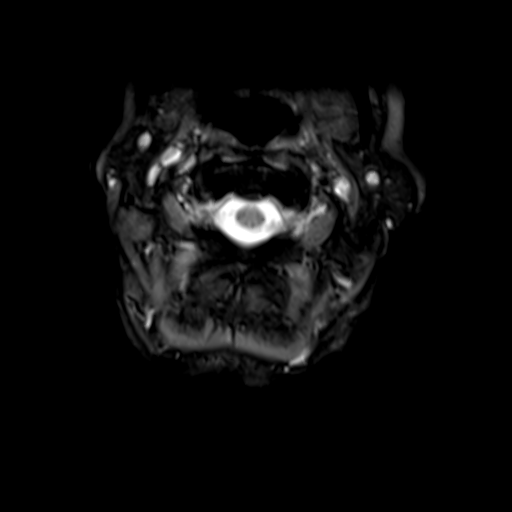
[im 32/36]
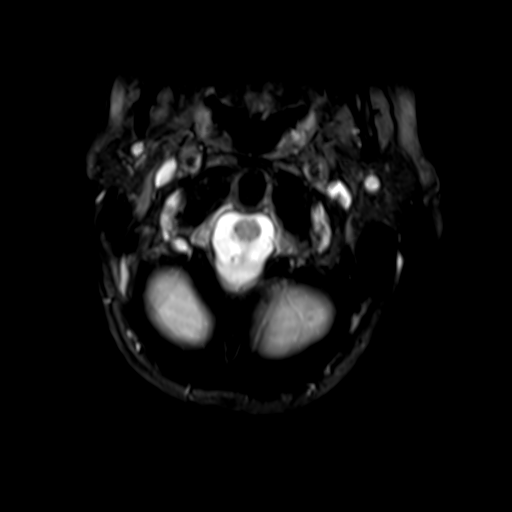
[im 36/36]
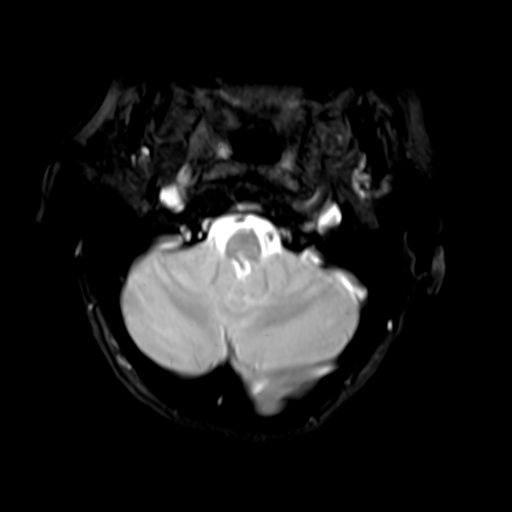

[33 of 48 positions shown; findings below may reference images not displayed]

FINDINGS: Alignment: Slight anterolisthesis at C7-T1

Vertebrae: No fracture, evidence of discitis, or bone lesion.

Cord: Normal signal and morphology.

Posterior Fossa, vertebral arteries, paraspinal tissues: Negative.

Disc levels:

C2-3: Tiny central protrusion

C3-4: Tiny central protrusion. Asymmetric right facet spurring. The
canal and foramina remain patent

C4-5: Disc narrowing with small central protrusion. Asymmetric right
facet spurring and uncovertebral ridging. Mild right foraminal
narrowing based on gradient images.

C5-6: Disc narrowing and endplate ridging with left foraminal
protrusion accentuated by uncovertebral spur. Left foraminal
impingement. Patent spinal canal

C6-7: Unremarkable.

C7-T1:Minor facet spurring. Bilateral root sleeve cyst which is also
seen at T1-2 on the left.
IMPRESSION: 1. Degenerative left foraminal impingement at C5-6.
2. Degenerative mild-to-moderate right foraminal narrowing at C4-5.
3. Diffusely patent spinal canal.

## 2021-09-19 ENCOUNTER — Telehealth: Payer: Self-pay | Admitting: Neurology

## 2021-09-19 NOTE — Telephone Encounter (Signed)
Called patients husband and gave results  ?

## 2021-09-19 NOTE — Telephone Encounter (Signed)
Patients husband called about Karen Nguyen's results of her MRI. [place] it has been two weeks ?

## 2021-09-19 NOTE — Telephone Encounter (Signed)
Pt's husband called in and left a message. He stated he was talking with someone, but got cut off. He was supposed to be getting results of MRI. ?

## 2021-12-30 ENCOUNTER — Other Ambulatory Visit: Payer: Self-pay | Admitting: Neurology

## 2021-12-30 DIAGNOSIS — R270 Ataxia, unspecified: Secondary | ICD-10-CM

## 2021-12-30 DIAGNOSIS — G3185 Corticobasal degeneration: Secondary | ICD-10-CM

## 2021-12-30 DIAGNOSIS — R251 Tremor, unspecified: Secondary | ICD-10-CM

## 2021-12-30 DIAGNOSIS — G255 Other chorea: Secondary | ICD-10-CM

## 2022-02-25 ENCOUNTER — Other Ambulatory Visit: Payer: Self-pay | Admitting: Neurology

## 2022-02-25 DIAGNOSIS — R251 Tremor, unspecified: Secondary | ICD-10-CM

## 2022-02-25 DIAGNOSIS — G3185 Corticobasal degeneration: Secondary | ICD-10-CM

## 2022-02-25 DIAGNOSIS — G255 Other chorea: Secondary | ICD-10-CM

## 2022-03-26 NOTE — Progress Notes (Unsigned)
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   -DaTscan positive, with decreased radiotracer activity in the right stratum (consistent with symptoms).   -Patient with multiple types of symptoms including chorea, tremor, occasional myoclonic movements.  That is certainly what points to the diagnosis of CBGD, but she is not completely asymmetric, which points away from the diagnosis.  Discussed this with patient and daughter today. -Genetic testing for HD negative.  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  -For now, she will increase carbidopa/levodopa 25/100, 1.5 tablet 3 times per day.  May need to increase this further in future. -Continue carbidopa/levodopa 50/200 CR at bed.     Subjective:   Karen Nguyen was seen today in follow-up.  Pt with husband and daughter who supplements the history.  We added levodopa at bedtime last visit to help with cramping at night.  They report that ***.  MRI cervical spine was also done just to make sure we were not missing anything.  While there were degenerative changes, there was no evidence of cervical myelopathy.  Current movement disorder medications: Carbidopa/levodopa 25/100, 1 tablet 3 times daily. Carbidopa/levodopa 50/200 at bedtime (added last visit)  ALLERGIES:   Allergies  Allergen Reactions   Codeine Phosphate [Codeine] Nausea And Vomiting   Latex Hives   Sulfur Rash   Lyrica Cr [Pregabalin Er]    Oxycodone     CURRENT MEDICATIONS:  Current Outpatient Medications  Medication Instructions   AMBULATORY NON FORMULARY MEDICATION Walker:  2 wheels, 2 tennis balls<BR>Dx: G20   aspirin 81 MG EC tablet Oral   Calcium Carb-Cholecalciferol (OS-CAL EXTRA D3) 500-600 MG-UNIT TABS Oral   carbidopa-levodopa (SINEMET CR) 50-200 MG tablet 1  tablet, Oral, Daily at bedtime   carbidopa-levodopa (SINEMET IR) 25-100 MG tablet Take 1.5 tablets at 7am/Take 1.5 tablets at 11am/Take 1.5 tablets at 4pm   ELDERBERRY PO Oral   ibuprofen (ADVIL) 200 mg, Oral, Every 4 hours PRN   losartan (COZAAR) 50 MG tablet 1 tablet, Oral, Daily   pravastatin (PRAVACHOL) 20 mg, Oral, Daily, Take 1 tablet by mouth every day for cholesterol   tiZANidine (ZANAFLEX) 2 MG tablet Take 1-2 tabs twice daily as needed for pain, insomnia.    Objective:   PHYSICAL EXAMINATION:    VITALS:   There were no vitals filed for this visit.      GEN:  The patient appears stated age and is in NAD. HEENT:  Normocephalic, atraumatic.  The mucous membranes are moist. The superficial temporal arteries are without ropiness or tenderness. CV:  RRR Lungs:  CTAB Neck/HEME:  There are no carotid bruits bilaterally. Musculoskeletal: The trunk is bent left.  Neurological examination:  Orientation: The patient is alert and oriented x3.  However, has significant trouble with finer aspects of the history with some clear cognitive changes and requires some assistance from her husband.   Cranial nerves: There is good facial symmetry.  Extraocular muscles are intact but she has significant trouble with smooth pursuit. The visual fields are full to confrontational testing. The speech is fluent and clear. Soft palate rises symmetrically and there is no tongue deviation. Hearing is intact to conversational tone. Motor: Strength is 5/5 in the bilateral upper and right lower extremities.   Shoulder shrug is equal and symmetric.  There  is no pronator drift.  Movement examination: Tone: There is normal tone in the bilateral upper extremities.  The tone in the lower extremities is normal.  Abnormal movements: Minimal choreiform movements today. Coordination:  There is significant slowness on the L.   Gait and Station: She is unbalanced and ataxic and slow.   Total time spent on today's  visit was  *** minutes, including both face-to-face time and nonface-to-face time.  Time included that spent on review of records (prior notes available to me/labs/imaging if pertinent), discussing treatment and goals, answering patient's questions and coordinating care.  Cc:  [name], DO

## 2022-03-27 ENCOUNTER — Ambulatory Visit (INDEPENDENT_AMBULATORY_CARE_PROVIDER_SITE_OTHER): Payer: Medicare Other | Admitting: Neurology

## 2022-03-27 VITALS — BP 117/74 | HR 74 | Ht 63.0 in | Wt 132.2 lb

## 2022-03-27 DIAGNOSIS — G3185 Corticobasal degeneration: Secondary | ICD-10-CM | POA: Diagnosis not present

## 2022-04-01 ENCOUNTER — Other Ambulatory Visit: Payer: Self-pay | Admitting: Neurology

## 2022-04-01 DIAGNOSIS — R270 Ataxia, unspecified: Secondary | ICD-10-CM

## 2022-04-01 DIAGNOSIS — G3185 Corticobasal degeneration: Secondary | ICD-10-CM

## 2022-04-01 DIAGNOSIS — G255 Other chorea: Secondary | ICD-10-CM

## 2022-04-01 DIAGNOSIS — R251 Tremor, unspecified: Secondary | ICD-10-CM

## 2022-04-09 ENCOUNTER — Telehealth: Payer: Self-pay

## 2022-04-09 NOTE — Telephone Encounter (Signed)
Called patient she said she doesn't want to do skin biopsy and she received and invatae kit in the mail and has chosen to not do that either.  She gave no reason other than she changed her mind .She will see you at the follow up appointment in May

## 2022-04-11 ENCOUNTER — Other Ambulatory Visit: Payer: Medicare Other | Admitting: Neurology

## 2022-06-09 ENCOUNTER — Other Ambulatory Visit: Payer: Self-pay | Admitting: Neurology

## 2022-06-09 DIAGNOSIS — G3185 Corticobasal degeneration: Secondary | ICD-10-CM

## 2022-06-09 DIAGNOSIS — R251 Tremor, unspecified: Secondary | ICD-10-CM

## 2022-06-09 DIAGNOSIS — G255 Other chorea: Secondary | ICD-10-CM

## 2022-08-04 ENCOUNTER — Encounter: Payer: Self-pay | Admitting: Neurology

## 2022-09-15 ENCOUNTER — Other Ambulatory Visit: Payer: Self-pay | Admitting: Neurology

## 2022-09-15 DIAGNOSIS — R251 Tremor, unspecified: Secondary | ICD-10-CM

## 2022-09-15 DIAGNOSIS — G3185 Corticobasal degeneration: Secondary | ICD-10-CM

## 2022-09-15 DIAGNOSIS — G255 Other chorea: Secondary | ICD-10-CM

## 2022-10-02 ENCOUNTER — Ambulatory Visit: Payer: Medicare Other | Admitting: Neurology

## 2022-10-20 ENCOUNTER — Other Ambulatory Visit: Payer: Self-pay | Admitting: Neurology

## 2022-10-20 DIAGNOSIS — G255 Other chorea: Secondary | ICD-10-CM

## 2022-10-20 DIAGNOSIS — G3185 Corticobasal degeneration: Secondary | ICD-10-CM

## 2022-10-20 DIAGNOSIS — R251 Tremor, unspecified: Secondary | ICD-10-CM

## 2022-10-20 DIAGNOSIS — R270 Ataxia, unspecified: Secondary | ICD-10-CM

## 2022-10-21 NOTE — Progress Notes (Unsigned)
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   -DaTscan positive, with decreased radiotracer activity in the right stratum (consistent with symptoms).   -Genetic testing for HD negative.  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  -increase carbidopa/levodopa 25/100, 2 tablet 3 times per day (just for ease of dosing - having trouble cutting pill).   -Continue carbidopa/levodopa 50/200 CR at bed.  Having trouble getting paid via [employer] so information given on [facility] -skin biopsy declined -[id number] testing declined via [facility]  Insomnia, due to nocturia  -discussed urology and believes pcp has a f/u scheduled Subjective:   Karen Nguyen was seen today in follow-up.  Pt with husband  who supplements the history.  She has had a few falls.  With one fall, she fell right on top of her husband.  Her husband states that "she doesn't have a good reverse."  She goes to rsb 3 days a week.     Current movement disorder medications: Carbidopa/levodopa 25/100, 1.5 tablet 3 times daily (increase) Carbidopa/levodopa 50/200 at bedtime (added last visit)  ALLERGIES:   Allergies  Allergen Reactions   Codeine Phosphate [Codeine] Nausea And Vomiting   Latex Hives   Sulfur Rash   Lyrica Cr [Pregabalin Er]    Oxycodone     CURRENT MEDICATIONS:  Current Outpatient Medications  Medication Instructions   AMBULATORY NON FORMULARY MEDICATION Walker:  2 wheels, 2 tennis balls<BR>Dx: G20   aspirin 81 MG EC tablet Oral   Calcium Carb-Cholecalciferol (OS-CAL EXTRA D3) 500-600 MG-UNIT TABS Oral   carbidopa-levodopa (SINEMET CR) 50-200 MG tablet 1 tablet, Oral, Daily at bedtime   carbidopa-levodopa (SINEMET IR) 25-100 MG tablet 2 tablets, Oral, 3 times daily   ibuprofen (ADVIL) 200 mg, Oral, Every 4  hours PRN   losartan (COZAAR) 50 MG tablet 1 tablet, Oral, Daily   Multiple Vitamins-Minerals (EMERGEN-C VITAMIN C) PACK 1 packet, Oral, Daily   pravastatin (PRAVACHOL) 20 mg, Oral, Daily, Take 1 tablet by mouth every day for cholesterol   tiZANidine (ZANAFLEX) 2 MG tablet Take 1-2 tabs twice daily as needed for pain, insomnia.    Objective:   PHYSICAL EXAMINATION:    VITALS:   Vitals:   10/23/22 1434  BP: 124/66  Pulse: 78  SpO2: 98%  Weight: 132 lb 12.8 oz (60.2 kg)  Height: 5' 2.5" (1.588 m)    GEN:  The patient appears stated age and is in NAD. HEENT:  Normocephalic, atraumatic.  The mucous membranes are moist. The superficial temporal arteries are without ropiness or tenderness. CV:  RRR Lungs:  CTAB Neck/HEME:  There are no carotid bruits bilaterally. Musculoskeletal: The trunk is bent left.  This is stable from previous.  Neurological examination:  Orientation: The patient is alert and oriented x3.   Cranial nerves: There is good facial symmetry.  Extraocular muscles are intact but she has significant trouble with smooth pursuit. The visual fields are full to confrontational testing. The speech is fluent and clear. Soft palate rises symmetrically and there is no tongue deviation. Hearing is intact to conversational tone. Motor: Strength is 5/5 in the bilateral upper and right lower extremities.   Shoulder shrug is equal and symmetric.  There is no pronator drift.  Movement examination: Tone: There is normal tone in the bilateral upper  extremities.  The tone in the lower extremities is normal.  Abnormal movements: She has choreiform movements, mostly on the left, but occasionally on the right. Coordination:  There is significant slowness on the L.   Gait and Station: She is apraxic with the R arm getting OOC.  Once out, she ambulates cautiously with the walker but has scoliosis to the L.    Total time spent on today's visit was  41 minutes, including both face-to-face  time and nonface-to-face time.  Time included that spent on review of records (prior notes available to me/labs/imaging if pertinent), discussing treatment and goals, answering patient's questions and coordinating care.  Cc:  Verdell Face, DO

## 2022-10-23 ENCOUNTER — Encounter: Payer: Self-pay | Admitting: Neurology

## 2022-10-23 ENCOUNTER — Ambulatory Visit: Payer: Medicare HMO | Admitting: Neurology

## 2022-10-23 DIAGNOSIS — G255 Other chorea: Secondary | ICD-10-CM | POA: Diagnosis not present

## 2022-10-23 DIAGNOSIS — G3185 Corticobasal degeneration: Secondary | ICD-10-CM

## 2022-10-23 DIAGNOSIS — R270 Ataxia, unspecified: Secondary | ICD-10-CM | POA: Diagnosis not present

## 2022-10-23 DIAGNOSIS — R251 Tremor, unspecified: Secondary | ICD-10-CM

## 2022-10-23 MED ORDER — CARBIDOPA-LEVODOPA 25-100 MG PO TABS: 2.0000 | ORAL_TABLET | Freq: Three times a day (TID) | ORAL | 1 refills | Status: DC

## 2022-10-23 NOTE — Patient Instructions (Addendum)
Increase carbidopa/levodopa 25/100, 2 tablets three times per day Continue carbidopa/levodopa 50/200 CR at bed  Your carbidopa/levodopa 50/200 will be cheaper through [facility].  You can set up an account if you want and then I can send a RX.   Let me know if you want me to do that

## 2022-12-20 ENCOUNTER — Other Ambulatory Visit: Payer: Self-pay | Admitting: Neurology

## 2022-12-20 DIAGNOSIS — R251 Tremor, unspecified: Secondary | ICD-10-CM

## 2022-12-20 DIAGNOSIS — G3185 Corticobasal degeneration: Secondary | ICD-10-CM

## 2022-12-20 DIAGNOSIS — G255 Other chorea: Secondary | ICD-10-CM

## 2023-01-12 ENCOUNTER — Other Ambulatory Visit: Payer: Self-pay | Admitting: Neurology

## 2023-01-12 DIAGNOSIS — G3185 Corticobasal degeneration: Secondary | ICD-10-CM

## 2023-01-12 DIAGNOSIS — R251 Tremor, unspecified: Secondary | ICD-10-CM

## 2023-01-12 DIAGNOSIS — G255 Other chorea: Secondary | ICD-10-CM

## 2023-01-13 ENCOUNTER — Other Ambulatory Visit: Payer: Self-pay | Admitting: Neurology

## 2023-01-13 DIAGNOSIS — G3185 Corticobasal degeneration: Secondary | ICD-10-CM

## 2023-01-13 DIAGNOSIS — R251 Tremor, unspecified: Secondary | ICD-10-CM

## 2023-01-13 DIAGNOSIS — R270 Ataxia, unspecified: Secondary | ICD-10-CM

## 2023-01-13 DIAGNOSIS — G255 Other chorea: Secondary | ICD-10-CM

## 2023-01-20 ENCOUNTER — Emergency Department (HOSPITAL_COMMUNITY): Payer: Medicare HMO

## 2023-01-20 ENCOUNTER — Other Ambulatory Visit: Payer: Self-pay

## 2023-01-20 ENCOUNTER — Observation Stay (HOSPITAL_COMMUNITY)
Admission: EM | Admit: 2023-01-20 | Discharge: 2023-01-22 | Disposition: A | Discharge status: Home Health | Payer: Medicare HMO | Attending: Internal Medicine | Admitting: Internal Medicine

## 2023-01-20 ENCOUNTER — Encounter (HOSPITAL_COMMUNITY): Payer: Self-pay | Admitting: Emergency Medicine

## 2023-01-20 DIAGNOSIS — R1310 Dysphagia, unspecified: Secondary | ICD-10-CM | POA: Diagnosis not present

## 2023-01-20 DIAGNOSIS — Z66 Do not resuscitate: Secondary | ICD-10-CM | POA: Diagnosis not present

## 2023-01-20 DIAGNOSIS — R739 Hyperglycemia, unspecified: Secondary | ICD-10-CM | POA: Diagnosis not present

## 2023-01-20 DIAGNOSIS — I340 Nonrheumatic mitral (valve) insufficiency: Secondary | ICD-10-CM | POA: Insufficient documentation

## 2023-01-20 DIAGNOSIS — Z87891 Personal history of nicotine dependence: Secondary | ICD-10-CM | POA: Diagnosis not present

## 2023-01-20 DIAGNOSIS — R55 Syncope and collapse: Secondary | ICD-10-CM

## 2023-01-20 DIAGNOSIS — R569 Unspecified convulsions: Secondary | ICD-10-CM | POA: Diagnosis present

## 2023-01-20 DIAGNOSIS — D500 Iron deficiency anemia secondary to blood loss (chronic): Secondary | ICD-10-CM | POA: Insufficient documentation

## 2023-01-20 DIAGNOSIS — R29818 Other symptoms and signs involving the nervous system: Secondary | ICD-10-CM

## 2023-01-20 DIAGNOSIS — M6281 Muscle weakness (generalized): Secondary | ICD-10-CM | POA: Diagnosis not present

## 2023-01-20 DIAGNOSIS — I10 Essential (primary) hypertension: Secondary | ICD-10-CM | POA: Diagnosis not present

## 2023-01-20 DIAGNOSIS — Z9181 History of falling: Secondary | ICD-10-CM | POA: Insufficient documentation

## 2023-01-20 DIAGNOSIS — Z7982 Long term (current) use of aspirin: Secondary | ICD-10-CM | POA: Insufficient documentation

## 2023-01-20 DIAGNOSIS — Z91040 Latex allergy status: Secondary | ICD-10-CM | POA: Diagnosis not present

## 2023-01-20 DIAGNOSIS — D649 Anemia, unspecified: Secondary | ICD-10-CM | POA: Insufficient documentation

## 2023-01-20 DIAGNOSIS — R770 Abnormality of albumin: Secondary | ICD-10-CM | POA: Insufficient documentation

## 2023-01-20 DIAGNOSIS — R2681 Unsteadiness on feet: Secondary | ICD-10-CM | POA: Diagnosis not present

## 2023-01-20 DIAGNOSIS — R42 Dizziness and giddiness: Secondary | ICD-10-CM

## 2023-01-20 LAB — BASIC METABOLIC PANEL
Anion gap: 13 (ref 5–15)
BUN: 11 mg/dL (ref 8–23)
CO2: 26 mmol/L (ref 22–32)
Calcium: 9.5 mg/dL (ref 8.9–10.3)
Chloride: 97 mmol/L — ABNORMAL LOW (ref 98–111)
Creatinine, Ser: 0.58 mg/dL (ref 0.44–1.00)
GFR, Estimated: >60 mL/min (ref >60)
Glucose, Bld: 117 mg/dL — ABNORMAL HIGH (ref 70–99)
Potassium: 3.6 mmol/L (ref 3.5–5.1)
Sodium: 136 mmol/L (ref 135–145)

## 2023-01-20 LAB — CBC
HCT: 33.4 % — ABNORMAL LOW (ref 36.0–46.0)
Hemoglobin: 10.6 g/dL — ABNORMAL LOW (ref 12.0–15.0)
MCH: 28.2 pg (ref 26.0–34.0)
MCHC: 31.7 g/dL (ref 30.0–36.0)
MCV: 88.8 fL (ref 80.0–100.0)
Platelets: 307 10*3/uL (ref 150–400)
RBC: 3.76 MIL/uL — ABNORMAL LOW (ref 3.87–5.11)
RDW: 14.6 % (ref 11.5–15.5)
WBC: 7.2 10*3/uL (ref 4.0–10.5)
nRBC: 0 % (ref 0.0–0.2)

## 2023-01-20 LAB — URINALYSIS, ROUTINE W REFLEX MICROSCOPIC
Bilirubin Urine: NEGATIVE
Glucose, UA: NEGATIVE mg/dL
Hgb urine dipstick: NEGATIVE
Ketones, ur: NEGATIVE mg/dL
Leukocytes,Ua: NEGATIVE
Nitrite: NEGATIVE
Protein, ur: NEGATIVE mg/dL
Specific Gravity, Urine: 1.005 (ref 1.005–1.030)
pH: 7 (ref 5.0–8.0)

## 2023-01-20 LAB — LIPASE, BLOOD: Lipase: 25 U/L (ref 11–51)

## 2023-01-20 LAB — CBG MONITORING, ED: Glucose-Capillary: 116 mg/dL — ABNORMAL HIGH (ref 70–99)

## 2023-01-20 MED ORDER — IOHEXOL 350 MG/ML SOLN
75.0000 mL | Freq: Once | INTRAVENOUS | Status: AC | PRN
  Administered 2023-01-20: 75 mL via INTRAVENOUS

## 2023-01-20 NOTE — Progress Notes (Signed)
LTM EEG running - no initial skin breakdown - push button tested - neuro notified.  

## 2023-01-20 NOTE — ED Triage Notes (Signed)
Pt over the last week has passed out approx 4 times after complaining nausea, dizziness. Pt has been having episodes of being disoriented, and generalized weakness. Pt awake, alert, appropriate at present.

## 2023-01-20 NOTE — ED Provider Notes (Signed)
[place] EMERGENCY DEPARTMENT AT [facility] Provider Note   CSN: [id number] Arrival date & time: 01/20/23  1418     History  Chief Complaint  Patient presents with   Loss of Consciousness   Nausea   Weakness    Karen Nguyen is a 72 y.o. female.   Loss of Consciousness Associated symptoms: weakness   Weakness Associated symptoms: syncope   Patient with history of Parkinson's.  Has had 4 separate episodes now of syncope.  Has happened over the last 2 to 3 weeks.  Reportedly has episodes where she will feel bad feel little nauseous and passed out.  Will be confused after.  Will be sleepy after will sleep for 4 to 6 hours and still be confused after that.  Patient does not remember the episodes.  History of Parkinson's and is actually been doing worse at home.  Difficulty with daily activities.  Sees Dr. Arbutus Leas from [facility] neurology.  No recent change in her medicines.  Denies chest pain.      Past Medical History:  Diagnosis Date   ACP (advance care planning)    Allergic rhinitis    Balance disorder    Constipation    Coordination of complex care    Dizziness    Encounter for preventive care    Essential tremor    Fall as cause of accidental injury at home as place of occurrence    Fibromyalgia    Hearing difficulty    Herpes zoster    History of cigarette smoking    Hot flashes    HTN (hypertension)    Hyperlipidemia    Inguinal hernia    Left ankle injury    Left shoulder pain    Long term use of drug    Memory changes    Occipital neuritis    Osteoarthritis of both hands    Osteoporosis    Raynaud's disease    Scoliosis    Status post inguinal hernia repair    Subcutaneous mass    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 Authorizing Provider  AMBULATORY NON FORMULARY MEDICATION Walker:  2 wheels, 2 tennis balls Dx: G20 08/29/21   TatOctaviano Batty, DO  aspirin 81 MG EC tablet Take by mouth.    [provider]  Calcium Carb-Cholecalciferol (OS-CAL EXTRA D3) 500-600 MG-UNIT TABS Take by mouth.    [provider]  carbidopa-levodopa (SINEMET CR) 50-200 MG tablet TAKE 1 TABLET BY MOUTH AT BEDTIME 01/12/23   Tat, Rebecca S, DO  carbidopa-levodopa (SINEMET IR) 25-100 MG tablet TAKE 1 & 1/2 TABLETS BY MOUTH AT 7AM, 11AM & 4PM 01/13/23   Tat, Octaviano Batty, DO  ibuprofen (ADVIL) 100 MG/5ML suspension Take 200 mg by mouth every 4 (four) hours as needed.    [provider]  losartan (COZAAR) 50 MG tablet Take 1 tablet by mouth daily. 08/26/19   [provider]  Multiple Vitamins-Minerals (EMERGEN-C VITAMIN C) PACK Take 1 packet by mouth daily. 09/05/19   [provider]  pravastatin (PRAVACHOL) 20 MG tablet Take 20 mg by mouth daily. Take 1 tablet by mouth every day for cholesterol    [provider]  tiZANidine (ZANAFLEX) 2 MG tablet Take 1-2 tabs twice daily as needed for pain, insomnia. 08/23/19   [provider]      Allergies    Codeine phosphate [  codeine], Latex, Sulfur, Lyrica cr [pregabalin er], and Oxycodone    Review of Systems   Review of Systems  Cardiovascular:  Positive for syncope.  Neurological:  Positive for weakness.    Physical Exam Updated Vital Signs BP (!) 147/72   Pulse 79   Temp 98.1 F (36.7 C) (Oral)   Resp 19   Ht 5\' 2"  (1.575 m)   Wt 58.1 kg   SpO2 98%   BMI 23.41 kg/m  Physical Exam Vitals and nursing note reviewed.  Constitutional:      Appearance: Normal appearance.  Cardiovascular:     Rate and Rhythm: Normal rate.  Chest:     Chest wall: No tenderness.  Abdominal:     General: There is no distension.  Neurological:     Mental Status: She is alert and oriented to person, place, and time.     Comments: Does have some bilateral tremors.     ED Results / Procedures / Treatments   Labs (all labs ordered are listed, but only  abnormal results are displayed) Labs Reviewed  BASIC METABOLIC PANEL - Abnormal; Notable for the following components:      Result Value   Chloride 97 (*)    Glucose, Bld 117 (*)    All other components within normal limits  CBC - Abnormal; Notable for the following components:   RBC 3.76 (*)    Hemoglobin 10.6 (*)    HCT 33.4 (*)    All other components within normal limits  URINALYSIS, ROUTINE W REFLEX MICROSCOPIC - Abnormal; Notable for the following components:   Color, Urine STRAW (*)    All other components within normal limits  CBG MONITORING, ED - Abnormal; Notable for the following components:   Glucose-Capillary 116 (*)    All other components within normal limits  LIPASE, BLOOD    EKG None  Radiology CT HEAD WO CONTRAST ( )  Result Date: 01/20/2023 CLINICAL DATA:  Headache, new onset (Age >= 51y) weakness EXAM: CT HEAD WITHOUT CONTRAST TECHNIQUE: Contiguous axial images were obtained from the base of the skull through the vertex without intravenous contrast. RADIATION DOSE REDUCTION: This exam was performed according to the departmental dose-optimization program which includes automated exposure control, adjustment of the mA and/or kV according to patient size and/or use of iterative reconstruction technique. COMPARISON:  None Available. FINDINGS: Brain: No evidence of acute infarction, hemorrhage, hydrocephalus, extra-axial collection or mass lesion/mass effect. Sequela of moderate to severe chronic microvascular ischemic change. Vascular: No hyperdense vessel or unexpected calcification. Skull: Normal. Negative for fracture or focal lesion. Sinuses/Orbits: No middle ear or mastoid effusion. Paranasal sinuses are clear. Orbits are unremarkable. Other: None. IMPRESSION: No acute intracranial abnormality. Sequela of moderate to severe chronic microvascular ischemic change. Electronically Signed   By: Lorenza Cambridge M.D.   On: 01/20/2023 16:46    Procedures Procedures     Medications Ordered in ED Medications  iohexol (OMNIPAQUE) 350 MG/ML injection 75 mL (75 mLs Intravenous Contrast Given 01/20/23 2213)    ED Course/ Medical Decision Making/ A&P                                 Medical Decision Making Amount and/or Complexity of Data Reviewed Labs: ordered.   Patient with syncopal episode.  Does not remember.  Differential diagnosis includes seizure, arrhythmia, even potentially stroke or posterior hypoperfusion.  Seen by neurology, with whom I discussed the case.  Head  CT reassuring overall.  Blood work overall reassuring with a mild anemia.  Will require mission the hospital.  More orders been done by neurology.  Will discuss with unassigned medicine for admission.        Final Clinical Impression(s) / ED Diagnoses Final diagnoses:  Syncope, unspecified syncope type    Rx / DC Orders ED Discharge Orders     None         Benjiman Core, MD 01/20/23 2224

## 2023-01-20 NOTE — Consult Note (Signed)
Neurology Consultation  Reason for Consult: Dizzy spells Referring Physician: Dr. Rubin Payor  CC: Dizzy spells, passing out  History is obtained from: Chart, patient, daughter  HPI: Karen Nguyen is a 72 y.o. 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  They report that she has episodes every now and then where she feels that she is getting dizzy, she sits, feels nauseous and has had episodes of vomiting at times and then loses consciousness sometimes not coming to for multiple hours and going into a deep sleep during which she is unresponsive.  She has no recollection of these events.  She only remembers feeling dizzy and the next and she knows that she woke up in the family telling her that she had passed out.  She has not hit her head with these episodes.  She has not had any shaking concerning for seizures clinically. Follows with movement disorder clinic-Dr. DAT.  Prior MRI with mild to moderate small vessel disease and chronic right frontal infarct.  DaTscan positive with decreased radiotracer activity in the right striate M.  Genetic testing done outpatient negative for HD.  Participates in rock steady boxing.  On carbidopa levodopa.  Denies chest pain shortness of breath.  Denies palpitations.  Denies urinary frequency but does have nocturia which disrupts her sleep.  Denies association of the symptoms of dizziness and passing out with position changes.  ROS: Full ROS was performed and is negative except as noted in the HPI.   Past Medical History:  Diagnosis Date   ACP (advance care planning)    Allergic rhinitis    Balance disorder    Constipation    Coordination of complex care    Dizziness     Encounter for preventive care    Essential tremor    Fall as cause of accidental injury at home as place of occurrence    Fibromyalgia    Hearing difficulty    Herpes zoster    History of cigarette smoking    Hot flashes    HTN (hypertension)    Hyperlipidemia    Inguinal hernia    Left ankle injury    Left shoulder pain    Long term use of drug    Memory changes    Occipital neuritis    Osteoarthritis of both hands    Osteoporosis    Raynaud's disease    Scoliosis    Status post inguinal hernia repair    Subcutaneous mass    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 She has never used smokeless tobacco. She reports that she does not drink alcohol and does not use drugs.  Medications No current facility-administered medications for this encounter.  Current Outpatient Medications:    AMBULATORY NON FORMULARY MEDICATION, Walker:  2 wheels, 2 tennis balls Dx: G20, Disp: 1 Device, Rfl: 0   aspirin 81 MG EC tablet, Take  by mouth., Disp: , Rfl:    Calcium Carb-Cholecalciferol (OS-CAL EXTRA D3) 500-600 MG-UNIT TABS, Take by mouth., Disp: , Rfl:    carbidopa-levodopa (SINEMET CR) 50-200 MG tablet, TAKE 1 TABLET BY MOUTH AT BEDTIME, Disp: 90 tablet, Rfl: 0   carbidopa-levodopa (SINEMET IR) 25-100 MG tablet, TAKE 1 & 1/2 TABLETS BY MOUTH AT 7AM, 11AM & 4PM, Disp: 405 tablet, Rfl: 0   ibuprofen (ADVIL) 100 MG/5ML suspension, Take 200 mg by mouth every 4 (four) hours as needed., Disp: , Rfl:    losartan (COZAAR) 50 MG tablet, Take 1 tablet by mouth daily., Disp: , Rfl:    Multiple Vitamins-Minerals (EMERGEN-C VITAMIN C) PACK, Take 1 packet by mouth daily., Disp: , Rfl:    pravastatin (PRAVACHOL) 20 MG tablet, Take  20 mg by mouth daily. Take 1 tablet by mouth every day for cholesterol, Disp: , Rfl:    tiZANidine (ZANAFLEX) 2 MG tablet, Take 1-2 tabs twice daily as needed for pain, insomnia., Disp: , Rfl:   Exam: Current vital signs: BP (!) 147/72   Pulse 79   Temp 98.1 F (36.7 C) (Oral)   Resp 19   Ht 5\' 2"  (1.575 m)   Wt 58.1 kg   SpO2 98%   BMI 23.41 kg/m  Vital signs in last 24 hours: Temp:  [98.1 F (36.7 C)] 98.1 F (36.7 C) (08/20 2004) Pulse Rate:  [69-82] 79 (08/20 2030) Resp:  [18-19] 19 (08/20 2030) BP: (121-147)/(61-100) 147/72 (08/20 2030) SpO2:  [98 %-100 %] 98 % (08/20 2030) Weight:  [58.1 kg] 58.1 kg (08/20 1441)  General: Awake alert in no distress HEENT: Normocephalic, atraumatic, moist oral mucous membranes. CVS: Regular rhythm Lungs: Clear Extremities warm well-perfused Neurological exam Awake alert oriented x 3.  No evidence of aphasia.  No evidence of dysarthria. Cranial nerves: Pupils equal round react light, extraocular movements intact although there is a broken smooth pursuit, visual fields full, face symmetric, facial sensation intact, diminished auditory acuity bilaterally, tongue and palate midline. Motor examination reveals antigravity nearly 5/5 strength symmetric in both upper and lower extremities.  Tone is essentially normal but at times does seem to have paratonia on testing.  She does have tremulousness versus choreiform movements on the left which have been previously documented. Sensation intact light touch Coordination: Diminished rapid alternating movements bilaterally worse on the left than the right. Gait testing was deferred  Labs I have reviewed labs in epic and the results pertinent to this consultation are: CBC    Component Value Date/Time   WBC 7.2 01/20/2023 1456   RBC 3.76 (L) 01/20/2023 1456   HGB 10.6 (L) 01/20/2023 1456   HCT 33.4 (L) 01/20/2023 1456   PLT 307 01/20/2023 1456   MCV 88.8 01/20/2023 1456   MCH 28.2 01/20/2023  1456   MCHC 31.7 01/20/2023 1456   RDW 14.6 01/20/2023 1456    CMP     Component Value Date/Time   NA 136 01/20/2023 1456   K 3.6 01/20/2023 1456   CL 97 (L) 01/20/2023 1456   CO2 26 01/20/2023 1456   GLUCOSE 117 (H) 01/20/2023 1456   BUN 11 01/20/2023 1456   CREATININE 0.58 01/20/2023 1456   CALCIUM 9.5 01/20/2023 1456   GFRNONAA >60 01/20/2023 1456    Imaging I have reviewed the images obtained:  CT-head done in the ED today: No acute changes.  Chronic microvascular disease.  Last MRI brain 11/11/2020 with no evidence of recent infarction hemorrhage or mass.  No abnormal enhancement.  Mild to moderate chronic microvascular ischemic changes.  Small chronic right frontal infarct.  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 Her exam is at baseline right now and has no new findings compared to the prior notes from outpatient neurology.  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 Differentials include seizures, cardiac arrhythmias, autonomic instability/orthostatic hypotension, vertebrobasilar insufficiency, possible underlying infection such as UTI or pneumonia and lower on the differentials is possible strokes and TIAs.  Recommendations: I would recommend observation for further testing MRI of the brain without contrast-will not pursue stroke/TIA risk factor workup unless the MRI is positive for stroke. CT angiography head and neck 2D echocardiogram Routine EEG followed by LTM EEG Orthostatic vitals Telemetry Chest x-ray, urinalysis Continue home doses of  levodopa carbidopa for now PT OT speech therapy If all of the above remains unremarkable, will need continued follow-up with outpatient neurology.  Plan discussed with Dr. Rubin Payor and the patient as well as patient's daughter at bedside. Neurology will follow the above studies with you.   -- Milon Dikes, MD Neurologist [facility] Pager: [phone]

## 2023-01-20 NOTE — Progress Notes (Signed)
Pt in Xray, attempting after

## 2023-01-20 NOTE — ED Provider Triage Note (Signed)
Emergency Medicine Provider Triage Evaluation Note  Karen Nguyen , a 72 y.o. female  was evaluated in triage.  Pt complains of weakness, dizziness which has been ongoing for about 4 weeks.  Not evaluated by PCP.  Underlying history of Parkinson's and does have her left arm contracted.  Daughter is concerned for mini strokes that have been going over time.  She is actually just coming in from [place].  Review of Systems  Positive: Weakness. Negative: Nausea, vomitign  Physical Exam  BP 121/61 (BP Location: Right Arm)   Pulse 69   Resp 18   Ht 5\' 2"  (1.575 m)   Wt 58.1 kg   SpO2 99%   BMI 23.41 kg/m  Gen:   Awake, no distress   Resp:  Normal effort  MSK:   Moves extremities without difficulty  Other:    Medical Decision Making  Medically screening exam initiated at 2:48 PM.  Appropriate orders placed.  Karen Nguyen was informed that the remainder of the evaluation will be completed by another provider, this initial triage assessment does not replace that evaluation, and the importance of remaining in the ED until their evaluation is complete.     Claude Manges, PA-C 01/20/23 1459

## 2023-01-21 ENCOUNTER — Observation Stay (HOSPITAL_COMMUNITY): Payer: Medicare HMO

## 2023-01-21 DIAGNOSIS — Z66 Do not resuscitate: Secondary | ICD-10-CM

## 2023-01-21 DIAGNOSIS — R569 Unspecified convulsions: Principal | ICD-10-CM

## 2023-01-21 DIAGNOSIS — R29818 Other symptoms and signs involving the nervous system: Secondary | ICD-10-CM

## 2023-01-21 DIAGNOSIS — R55 Syncope and collapse: Secondary | ICD-10-CM | POA: Diagnosis not present

## 2023-01-21 DIAGNOSIS — D649 Anemia, unspecified: Secondary | ICD-10-CM

## 2023-01-21 DIAGNOSIS — I10 Essential (primary) hypertension: Secondary | ICD-10-CM

## 2023-01-21 LAB — ECHOCARDIOGRAM COMPLETE
Area-P 1/2: 3.37 cm2
Calc EF: 69.4 %
Height: 62 in
P 1/2 time: 522 ms
S' Lateral: 2.8 cm
Single Plane A2C EF: 69.2 %
Single Plane A4C EF: 70 %
Weight: 2048 [oz_av]

## 2023-01-21 LAB — COMPREHENSIVE METABOLIC PANEL
ALT: <5 U/L (ref 0–44)
AST: 20 U/L (ref 15–41)
Albumin: 3.4 g/dL — ABNORMAL LOW (ref 3.5–5.0)
Alkaline Phosphatase: 62 U/L (ref 38–126)
Anion gap: 13 (ref 5–15)
BUN: 9 mg/dL (ref 8–23)
CO2: 26 mmol/L (ref 22–32)
Calcium: 9.7 mg/dL (ref 8.9–10.3)
Chloride: 100 mmol/L (ref 98–111)
Creatinine, Ser: 0.69 mg/dL (ref 0.44–1.00)
GFR, Estimated: >60 mL/min (ref >60)
Glucose, Bld: 99 mg/dL (ref 70–99)
Potassium: 3.8 mmol/L (ref 3.5–5.1)
Sodium: 139 mmol/L (ref 135–145)
Total Bilirubin: 0.7 mg/dL (ref 0.3–1.2)
Total Protein: 6.3 g/dL — ABNORMAL LOW (ref 6.5–8.1)

## 2023-01-21 LAB — CBC WITH DIFFERENTIAL/PLATELET
Abs Immature Granulocytes: 0.02 10*3/uL (ref 0.00–0.07)
Basophils Absolute: 0 10*3/uL (ref 0.0–0.1)
Basophils Relative: 1 %
Eosinophils Absolute: 0.1 10*3/uL (ref 0.0–0.5)
Eosinophils Relative: 1 %
HCT: 31.9 % — ABNORMAL LOW (ref 36.0–46.0)
Hemoglobin: 10.2 g/dL — ABNORMAL LOW (ref 12.0–15.0)
Immature Granulocytes: 0 %
Lymphocytes Relative: 22 %
Lymphs Abs: 1.6 10*3/uL (ref 0.7–4.0)
MCH: 28.2 pg (ref 26.0–34.0)
MCHC: 32 g/dL (ref 30.0–36.0)
MCV: 88.1 fL (ref 80.0–100.0)
Monocytes Absolute: 0.7 10*3/uL (ref 0.1–1.0)
Monocytes Relative: 10 %
Neutro Abs: 4.7 10*3/uL (ref 1.7–7.7)
Neutrophils Relative %: 66 %
Platelets: 269 10*3/uL (ref 150–400)
RBC: 3.62 MIL/uL — ABNORMAL LOW (ref 3.87–5.11)
RDW: 14.6 % (ref 11.5–15.5)
WBC: 7.2 10*3/uL (ref 4.0–10.5)
nRBC: 0 % (ref 0.0–0.2)

## 2023-01-21 LAB — LIPID PANEL
Cholesterol: 139 mg/dL (ref 0–200)
HDL: 62 mg/dL (ref >40)
LDL Cholesterol: 64 mg/dL (ref 0–99)
Total CHOL/HDL Ratio: 2.2 RATIO
Triglycerides: 64 mg/dL (ref <150)
VLDL: 13 mg/dL (ref 0–40)

## 2023-01-21 LAB — HEMOGLOBIN A1C
Hgb A1c MFr Bld: 5.2 % (ref 4.8–5.6)
Mean Plasma Glucose: 102.54 mg/dL

## 2023-01-21 LAB — FERRITIN: Ferritin: 8 ng/mL — ABNORMAL LOW (ref 11–307)

## 2023-01-21 LAB — VITAMIN B12: Vitamin B-12: 664 pg/mL (ref 180–914)

## 2023-01-21 LAB — MAGNESIUM: Magnesium: 1.8 mg/dL (ref 1.7–2.4)

## 2023-01-21 LAB — IRON AND TIBC
Iron: 34 ug/dL (ref 28–170)
Saturation Ratios: 9 % — ABNORMAL LOW (ref 10.4–31.8)
TIBC: 386 ug/dL (ref 250–450)
UIBC: 352 ug/dL

## 2023-01-21 LAB — RETICULOCYTES
Immature Retic Fract: 3.5 % (ref 2.3–15.9)
RBC.: 3.62 MIL/uL — ABNORMAL LOW (ref 3.87–5.11)
Retic Count, Absolute: 31.5 10*3/uL (ref 19.0–186.0)
Retic Ct Pct: 0.9 % (ref 0.4–3.1)

## 2023-01-21 LAB — PHOSPHORUS: Phosphorus: 3.3 mg/dL (ref 2.5–4.6)

## 2023-01-21 LAB — FOLATE: Folate: 26.1 ng/mL (ref >5.9)

## 2023-01-21 MED ORDER — HEPARIN SODIUM (PORCINE) 5000 UNIT/ML IJ SOLN
5000.0000 [IU] | Freq: Three times a day (TID) | INTRAMUSCULAR | Status: DC
  Administered 2023-01-21 – 2023-01-22 (×5): 5000 [IU] via SUBCUTANEOUS
  Filled 2023-01-21 (×5): qty 1

## 2023-01-21 MED ORDER — PRAVASTATIN SODIUM 40 MG PO TABS
20.0000 mg | ORAL_TABLET | Freq: Every day | ORAL | Status: DC
  Administered 2023-01-21: 20 mg via ORAL
  Filled 2023-01-21: qty 1

## 2023-01-21 MED ORDER — MAGNESIUM SULFATE 2 GM/50ML IV SOLN
2.0000 g | Freq: Once | INTRAVENOUS | Status: AC
  Administered 2023-01-21: 2 g via INTRAVENOUS
  Filled 2023-01-21: qty 50

## 2023-01-21 MED ORDER — ONDANSETRON HCL 4 MG PO TABS: 4.0000 mg | ORAL_TABLET | Freq: Four times a day (QID) | ORAL | Status: DC | PRN

## 2023-01-21 MED ORDER — ONDANSETRON HCL 4 MG/2ML IJ SOLN: 4.0000 mg | Freq: Four times a day (QID) | INTRAMUSCULAR | Status: DC | PRN

## 2023-01-21 MED ORDER — ASPIRIN 81 MG PO TBEC
81.0000 mg | DELAYED_RELEASE_TABLET | Freq: Every day | ORAL | Status: DC
  Administered 2023-01-21 – 2023-01-22 (×2): 81 mg via ORAL
  Filled 2023-01-21 (×2): qty 1

## 2023-01-21 MED ORDER — CARBIDOPA-LEVODOPA 25-100 MG PO TABS
1.5000 | ORAL_TABLET | Freq: Three times a day (TID) | ORAL | Status: DC
  Administered 2023-01-21 – 2023-01-22 (×5): 1.5 via ORAL
  Filled 2023-01-21: qty 2
  Filled 2023-01-21: qty 1.5
  Filled 2023-01-21 (×2): qty 2
  Filled 2023-01-21: qty 1.5
  Filled 2023-01-21: qty 2

## 2023-01-21 MED ORDER — ACETAMINOPHEN 650 MG RE SUPP: 650.0000 mg | Freq: Four times a day (QID) | RECTAL | Status: DC | PRN

## 2023-01-21 MED ORDER — CARBIDOPA-LEVODOPA ER 50-200 MG PO TBCR
1.0000 | EXTENDED_RELEASE_TABLET | Freq: Every day | ORAL | Status: DC
  Administered 2023-01-21 (×2): 1 via ORAL
  Filled 2023-01-21 (×4): qty 1

## 2023-01-21 MED ORDER — ACETAMINOPHEN 325 MG PO TABS
650.0000 mg | ORAL_TABLET | Freq: Four times a day (QID) | ORAL | Status: DC | PRN
  Administered 2023-01-21: 650 mg via ORAL
  Filled 2023-01-21 (×2): qty 2

## 2023-01-21 NOTE — ED Notes (Signed)
ED TO INPATIENT HANDOFF REPORT  ED Nurse Name and Phone #: Theophilus Bones [phone]  S Name/Age/Gender Karen Nguyen 72 y.o. female Room/Bed: 041C/041C  Code Status   Code Status: DNR  Home/SNF/Other Home Patient oriented to: self, place, time, and situation Is this baseline? Yes   Triage Complete: Triage complete  Chief Complaint Seizure-like activity [facility]) [R56.9]  Triage Note Pt over the last week has passed out approx 4 times after complaining nausea, dizziness. Pt has been having episodes of being disoriented, and generalized weakness. Pt awake, alert, appropriate at present.    Allergies Allergies  Allergen Reactions   Codeine Phosphate [Codeine] Nausea And Vomiting   Latex Hives   Sulfur Rash   Lyrica Cr [Pregabalin Er]     Unknown reaction   Oxycodone Rash    Rash on roof of mouth    Level of Care/Admitting Diagnosis ED Disposition     ED Disposition  Admit   Condition  --   Comment  Hospital Area: [facility] [[id number]]  Level of Care: Telemetry Medical [104]  May place patient in observation at [facility] or [facility] if equivalent level of care is available:: No  Covid Evaluation: Asymptomatic - no recent exposure (last 10 days) testing not required  Diagnosis: Seizure-like activity [facility]) [[id number]]  Admitting Physician: Imogene Burn, ERIC [[id number]]  Attending Physician: Imogene Burn, ERIC [[id number]]          B Medical/Surgery History Past Medical History:  Diagnosis Date   ACP (advance care planning)    Allergic rhinitis    Balance disorder    Constipation    Coordination of complex care    Dizziness    Encounter for preventive care    Essential tremor    Fall as cause of accidental injury at home as place of occurrence    Fibromyalgia    Hearing difficulty    Herpes zoster    History of cigarette smoking    Hot flashes    HTN (hypertension)    Hyperlipidemia    Inguinal hernia    Left ankle injury    Left shoulder pain    Long term use  of drug    Memory changes    Occipital neuritis    Osteoarthritis of both hands    Osteoporosis    Raynaud's disease    Scoliosis    Status post inguinal hernia repair    Subcutaneous mass    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    Past Surgical History:  Procedure Laterality Date   breast lump removal     benign   SPINE SURGERY     UMBILICAL HERNIA REPAIR       A IV Location/Drains/Wounds Patient Lines/Drains/Airways Status     Active Line/Drains/Airways     Name Placement date Placement time Site Days   Peripheral IV 01/20/23 20 G 1" Anterior;Distal;Right Forearm 01/20/23  2017  Forearm  1            Intake/Output Last 24 hours No intake or output data in the 24 hours ending 01/21/23 1191  Labs/Imaging Results for orders placed or performed during the hospital encounter of 01/20/23 (from the past 48 hour(s))  CBG monitoring, ED     Status: Abnormal   Collection Time: 01/20/23  2:46 PM  Result Value Ref Range   Glucose-Capillary 116 (H) 70 - 99 mg/dL    Comment: Glucose reference range applies only to samples taken after fasting for at least 8  hours.  Basic metabolic panel     Status: Abnormal   Collection Time: 01/20/23  2:56 PM  Result Value Ref Range   Sodium 136 135 - 145 mmol/L   Potassium 3.6 3.5 - 5.1 mmol/L   Chloride 97 (L) 98 - 111 mmol/L   CO2 26 22 - 32 mmol/L   Glucose, Bld 117 (H) 70 - 99 mg/dL    Comment: Glucose reference range applies only to samples taken after fasting for at least 8 hours.   BUN 11 8 - 23 mg/dL   Creatinine, Ser 0.10 0.44 - 1.00 mg/dL   Calcium 9.5 8.9 - 27.2 mg/dL   GFR, Estimated >53 >66 mL/min    Comment: (NOTE) Calculated using the CKD-EPI Creatinine Equation (2021)    Anion gap 13 5 - 15    Comment: Performed at [facility], [address], [place], [place] [address]  CBC     Status: Abnormal   Collection Time: 01/20/23  2:56 PM  Result Value Ref Range   WBC 7.2 4.0 - 10.5 K/uL   RBC 3.76 (L)  3.87 - 5.11 MIL/uL   Hemoglobin 10.6 (L) 12.0 - 15.0 g/dL   HCT 74.2 (L) 59.5 - 63.8 %   MCV 88.8 80.0 - 100.0 fL   MCH 28.2 26.0 - 34.0 pg   MCHC 31.7 30.0 - 36.0 g/dL   RDW 75.6 43.3 - 29.5 %   Platelets 307 150 - 400 K/uL   nRBC 0.0 0.0 - 0.2 %    Comment: Performed at [facility], [address], [place], [place] [address]  Lipase, blood     Status: None   Collection Time: 01/20/23  2:56 PM  Result Value Ref Range   Lipase 25 11 - 51 U/L    Comment: Performed at [facility], [address] [place], [place] [address]  Hemoglobin A1c     Status: None   Collection Time: 01/20/23  3:13 PM  Result Value Ref Range   Hgb A1c MFr Bld 5.2 4.8 - 5.6 %    Comment: (NOTE) Pre diabetes:          5.7%-6.4%  Diabetes:              >6.4%  Glycemic control for   <7.0% adults with diabetes    Mean Plasma Glucose 102.54 mg/dL    Comment: Performed at [facility], [address], [place], [place] [address]  Urinalysis, Routine w reflex microscopic -Urine, Clean Catch     Status: Abnormal   Collection Time: 01/20/23  9:59 PM  Result Value Ref Range   Color, Urine STRAW (A) YELLOW   APPearance CLEAR CLEAR   Specific Gravity, Urine 1.005 1.005 - 1.030   pH 7.0 5.0 - 8.0   Glucose, UA NEGATIVE NEGATIVE mg/dL   Hgb urine dipstick NEGATIVE NEGATIVE   Bilirubin Urine NEGATIVE NEGATIVE   Ketones, ur NEGATIVE NEGATIVE mg/dL   Protein, ur NEGATIVE NEGATIVE mg/dL   Nitrite NEGATIVE NEGATIVE   Leukocytes,Ua NEGATIVE NEGATIVE    Comment: Performed at [facility], [address] [place], [place] [address]   MR BRAIN WO CONTRAST  Result Date: 01/21/2023 CLINICAL DATA:  Syncopal episodes, nausea, dizziness, weakness EXAM: MRI HEAD WITHOUT CONTRAST TECHNIQUE: Multiplanar, multiecho pulse sequences of the brain and surrounding structures were obtained without intravenous contrast. COMPARISON:  11/10/2020 MRI head, correlation is also made with 01/20/2023 CT head FINDINGS:  Brain: No restricted  diffusion to suggest acute or subacute infarct. No acute hemorrhage, mass, mass effect, or midline shift. No hydrocephalus or extra-axial collection. Normal craniocervical junction. Punctate focus of hemosiderin deposition in the posterior right temporal lobe, likely sequela of hypertensive microhemorrhage. Scattered and confluent T2 hyperintense signal in the periventricular white matter, likely the sequela of moderate chronic small vessel ischemic disease. Mildly advanced cerebral atrophy for age. Vascular: Normal arterial flow voids. Skull and upper cervical spine: Normal marrow signal. Sinuses/Orbits: Clear paranasal sinuses. No acute finding in the orbits. Other: The mastoid air cells are well aerated. IMPRESSION: No acute intracranial process. No evidence of acute or subacute infarct. Electronically Signed   By: Wiliam Ke M.D.   On: 01/21/2023 02:14   CT ANGIO HEAD NECK W WO CM  Result Date: 01/20/2023 CLINICAL DATA:  Syncope, nausea, dizziness, weakness EXAM: CT ANGIOGRAPHY HEAD AND NECK WITH AND WITHOUT CONTRAST TECHNIQUE: Multidetector CT imaging of the head and neck was performed using the standard protocol during bolus administration of intravenous contrast. Multiplanar CT image reconstructions and MIPs were obtained to evaluate the vascular anatomy. Carotid stenosis measurements (when applicable) are obtained utilizing NASCET criteria, using the distal internal carotid diameter as the denominator. RADIATION DOSE REDUCTION: This exam was performed according to the departmental dose-optimization program which includes automated exposure control, adjustment of the mA and/or kV according to patient size and/or use of iterative reconstruction technique. CONTRAST:  75mL OMNIPAQUE IOHEXOL 350 MG/ML SOLN COMPARISON:  No prior CTA available, correlation is made with 01/20/2023 CT head FINDINGS: CT HEAD FINDINGS For noncontrast findings, please see same day CT head. CTA NECK FINDINGS  Aortic arch: Two-vessel arch with a common origin of the brachiocephalic and left common carotid arteries. Imaged portion shows no evidence of aneurysm or dissection. No significant stenosis of the major arch vessel origins. Aortic atherosclerosis. Right carotid system: No evidence of dissection, occlusion, or hemodynamically significant stenosis (greater than 50%). Left carotid system: No evidence of dissection, occlusion, or hemodynamically significant stenosis (greater than 50%). Vertebral arteries: No evidence of dissection, occlusion, or hemodynamically significant stenosis (greater than 50%). Skeleton: No acute osseous abnormality. Degenerative changes in the cervical spine. Other neck: A small hypoenhancing foci in the thyroid, the largest of which measures up to 3 mm, Fortune 0 follow-up is indicated. (Reference: J Am Coll Radiol. 2015 Feb;12(2): 143-50) Upper chest: No focal pulmonary opacity or pleural effusion. Emphysema. Review of the MIP images confirms the above findings CTA HEAD FINDINGS Anterior circulation: Both internal carotid arteries are patent to the termini, without significant stenosis. A1 segments patent. Normal anterior communicating artery. Anterior cerebral arteries are patent to their distal aspects without significant stenosis. No M1 stenosis or occlusion. MCA branches perfused to their distal aspects without significant stenosis. Posterior circulation: Vertebral arteries patent to the vertebrobasilar junction without significant stenosis. Posterior inferior cerebellar arteries patent proximally. Basilar patent to its distal aspect without significant stenosis. Superior cerebellar arteries patent proximally. Patent P1 segments. Near fetal origin of the left PCA from the left posterior communicating artery. PCAs perfused to their distal aspects without significant stenosis. The bilateral posterior communicating arteries are patent. Venous sinuses: Patent. Anatomic variants: Near fetal  origin of the left PCA. Review of the MIP images confirms the above findings IMPRESSION: 1. No intracranial large vessel occlusion or significant stenosis. 2. No hemodynamically significant stenosis in the neck. 3. Aortic atherosclerosis. Aortic Atherosclerosis (ICD10-I70.0). Electronically Signed   By: Wiliam Ke M.D.   On: 01/20/2023 23:54   DG Chest 1  View  Result Date: 01/20/2023 CLINICAL DATA:  Cough EXAM: CHEST  1 VIEW COMPARISON:  None Available. FINDINGS: There strandy opacities in both lung bases. The cardiomediastinal silhouette is within normal limits. Costophrenic angles are clear. There is no pneumothorax or acute fracture. IMPRESSION: Strandy opacities in both lung bases, atelectasis versus infection. Electronically Signed   By: Darliss Cheney M.D.   On: 01/20/2023 23:13   CT HEAD WO CONTRAST ( )  Result Date: 01/20/2023 CLINICAL DATA:  Headache, new onset (Age >= 51y) weakness EXAM: CT HEAD WITHOUT CONTRAST TECHNIQUE: Contiguous axial images were obtained from the base of the skull through the vertex without intravenous contrast. RADIATION DOSE REDUCTION: This exam was performed according to the departmental dose-optimization program which includes automated exposure control, adjustment of the mA and/or kV according to patient size and/or use of iterative reconstruction technique. COMPARISON:  None Available. FINDINGS: Brain: No evidence of acute infarction, hemorrhage, hydrocephalus, extra-axial collection or mass lesion/mass effect. Sequela of moderate to severe chronic microvascular ischemic change. Vascular: No hyperdense vessel or unexpected calcification. Skull: Normal. Negative for fracture or focal lesion. Sinuses/Orbits: No middle ear or mastoid effusion. Paranasal sinuses are clear. Orbits are unremarkable. Other: None. IMPRESSION: No acute intracranial abnormality. Sequela of moderate to severe chronic microvascular ischemic change. Electronically Signed   By: Lorenza Cambridge M.D.    On: 01/20/2023 16:46    Pending Labs Unresulted Labs (From admission, onward)     Start     Ordered   01/21/23 0033  Lipid panel  Add-on,   AD        01/21/23 0032   01/21/23 0030  Iron and TIBC  Add-on,   AD        01/21/23 0029            Vitals/Pain Today's Vitals   01/21/23 0245 01/21/23 0345 01/21/23 0505 01/21/23 0519  BP: (!) 134/96 128/88  124/81  Pulse: 75 81  65  Resp: 16 12  11   Temp:      TempSrc:      SpO2: 96% 95%  94%  Weight:      Height:      PainSc:   3      Isolation Precautions No active isolations  Medications Medications  aspirin EC tablet 81 mg (has no administration in time range)  pravastatin (PRAVACHOL) tablet 20 mg (has no administration in time range)  carbidopa-levodopa (SINEMET CR) 50-200 MG per tablet controlled release 1 tablet (1 tablet Oral Given 01/21/23 0243)  carbidopa-levodopa (SINEMET IR) 25-100 MG per tablet immediate release 1.5 tablet (1.5 tablets Oral Given 01/21/23 0644)  heparin injection 5,000 Units (5,000 Units Subcutaneous Given 01/21/23 0645)  acetaminophen (TYLENOL) tablet 650 mg (650 mg Oral Given 01/21/23 0407)    Or  acetaminophen (TYLENOL) suppository 650 mg ( Rectal See Alternative 01/21/23 0407)  ondansetron (ZOFRAN) tablet 4 mg (has no administration in time range)    Or  ondansetron (ZOFRAN) injection 4 mg (has no administration in time range)  iohexol (OMNIPAQUE) 350 MG/ML injection 75 mL (75 mLs Intravenous Contrast Given 01/20/23 2213)    Mobility walks     Focused Assessments Neuro Assessment Handoff:  Swallow screen pass? Yes    NIH Stroke Scale  Dizziness Present: No Headache Present: No Interval: Initial Level of Consciousness (1a.)   : Alert, keenly responsive LOC Questions (1b. )   : Answers both questions correctly LOC Commands (1c. )   : Performs both tasks correctly Best Gaze (2. )  :  Normal Visual (3. )  : No visual loss Facial Palsy (4. )    : Normal symmetrical movements Motor  Arm, Left (5a. )   : Drift Motor Arm, Right (5b. ) : No drift Motor Leg, Left (6a. )  : No drift Motor Leg, Right (6b. ) : No drift Limb Ataxia (7. ): Absent Sensory (8. )  : Normal, no sensory loss Best Language (9. )  : No aphasia Dysarthria (10. ): Normal Extinction/Inattention (11.)   : No Abnormality Complete NIHSS TOTAL: 1     Neuro Assessment: Within Defined Limits Neuro Checks:   Initial (01/20/23 2143)  Has TPA been given? No If patient is a Neuro Trauma and patient is going to OR before floor call report to 4N Charge nurse: [phone] or [phone]   R Recommendations: See Admitting Provider Note  Report given to:   Additional Notes: pt on fall precaution.

## 2023-01-21 NOTE — Plan of Care (Signed)
  Problem: Education: Goal: Knowledge of General Education information will improve Description Including pain rating scale, medication(s)/side effects and non-pharmacologic comfort measures Outcome: Progressing   

## 2023-01-21 NOTE — Assessment & Plan Note (Signed)
Currently being treated by Dr. 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

## 2023-01-21 NOTE — Assessment & Plan Note (Signed)
Will hold off on BP meds until after MRI brain to r/o stroke.

## 2023-01-21 NOTE — Procedures (Signed)
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 EEG to evaluate for seizure  Level of alertness: Awake, asleep  AEDs during EEG study: None  Technical aspects: This EEG study was done with scalp electrodes positioned according to the 10-20 International system of electrode placement. Electrical activity was reviewed with band pass filter of 1-70Hz , sensitivity of 7 uV/mm, display speed of 56mm/sec with a 60Hz  notched filter applied as appropriate. EEG data were recorded continuously and digitally stored.  Video monitoring was available and reviewed as appropriate.  Description: The posterior dominant rhythm consists of 13 Hz activity of moderate voltage (25-35 uV) seen predominantly in posterior head regions, symmetric and reactive to eye opening and eye closing. Sleep was characterized by vertex waves, sleep spindles (12 to 14 Hz), maximal frontocentral region. EEG showed continuous 3 to 6 Hz theta-delta slowing in right hemisphere. Hyperventilation and photic stimulation were not performed.     ABNORMALITY - Continuous slow, right hemisphere  IMPRESSION: This study is suggestive of cortical dysfunction arising from right hemisphere likely secondary to underlying structural abnormality. No seizures or epileptiform discharges were seen throughout the recording.  Karen Nguyen [name]

## 2023-01-21 NOTE — Assessment & Plan Note (Signed)
Check iron profile.

## 2023-01-21 NOTE — Assessment & Plan Note (Addendum)
Observation med/tele bed. Seen by neurology. Undergoing EEG. [facility] neurology workup. See their recommendations with MRI brain, CTA head/neck, echo,

## 2023-01-21 NOTE — Progress Notes (Signed)
  Echocardiogram 2D Echocardiogram has been performed.  Karen Nguyen 01/21/2023, 2:23 PM

## 2023-01-21 NOTE — Subjective & Objective (Signed)
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 Arbutus Leas with neurology, hypertension who presents to the ER today with repeated bouts of seizure-like activity.  The daughter Mel Almond states that the patient will have episode where she will get very dizzy, gets nauseated, sometimes will vomit.  Soon after she loses consciousness for several minutes.  When she wakes up, she sleeps about 4 to 6 hours.  She is then very unsteady on her feet and dizzy.  She wears adult diapers most the time.  Daughter is not aware of any urinary continence since she is wearing diapers.  Patient lives with her husband.  She has a caretaker.  Daughter lives in [place].  This happened about 4 times the last several months.  She has frequent falls.  She is not on any anticoagulants.  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  On arrival to the ER, temp 98.1 heart rate 69 blood pressure 121/61 satting 99% on room air.  UA negative Sodium 136, potassium 3.6, chloride 97, bicarb 26, BUN of 11, creatinine 0.58  Lipase 25  White count 7.2, hemoglobin 10.6, platelets of 307  CT the head was negative for acute intracranial abnormality.  There is moderate to severe chronic small vessel ischemic changes. CT angio head and neck negative for LVO. Chest x-ray shows some atelectasis in the bases.  Neurology has been consulted.  EEG is started.  [facility] hospitalist consulted.

## 2023-01-21 NOTE — Assessment & Plan Note (Signed)
01-21-2023. Verified with pt that she wants to be DNR/DNI. Witnessed by her dtr([facility]). Pt states she has the yellow DNR form at home.

## 2023-01-21 NOTE — Progress Notes (Signed)
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 Arbutus Leas of neurology, hypertension as well as other comorbidities including but not limited to constipation, dizziness, fibromyalgia, history of tobacco abuse, hyperlipidemia, osteoporosis, Raynaud's who presented to the hospital for seizure-like spells.  The patient's daughter had stated the patient will have episodes where she will get very dizzy gets nauseous and will sometimes vomit and then shortly after she loses consciousness for several minutes and then when she wakes up she sleeps for about 4 to 6 hours and is very unsteady on her feet and dizzy.  She wears adult diapers most of the time and not is not aware of any urinary continence since she is wearing diapers.  Patient states this happened about 4 times in the last several months and has been been happening more frequently.  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  On arrival to the ED she had a normal temperature and heart rate of 69 blood pressure 120/61 and she was saturating well on room air.  She was worked up and had a CT head that was negative for any acute intracranial abnormality but there was moderate to severe chronic small vessel ischemic changes and CT angio of the head and neck was negative for LVO.  Chest x-ray showed some atelectasis at the bases and EEG was being obtained and neurology been consulted.  EEG done and showed that the study was suggestive of cortical dysfunction arising from the right hemisphere likely secondary to underlying structural abnormality with no seizures or epileptiform discharges seen throughout the recording.  Neurology also recommended  further workup with an MRI of the brain without contrast and would not pursue stroke or TIA risk factor workup unless MRI is positive for stroke  Thus recommended a 2D echocardiogram as well as orthostatic vital signs, monitoring on telemetry as well as obtain a chest x-ray and urinalysis.  I recommending also obtain PT and OT to further evaluate and treat and they feel that if the results are unremarkable she will need outpatient neurology follow-up.  Currently she is being admitted and treated for the following but limited to:  Assessment and Plan:  Seizure-like activity (HCC) -Placed Observation med/tele bed.  -Seen by neurology.  -Undergoing EE and it showed that the study was suggestive of cortical dysfunction arising from the right hemisphere likely secondary to underlying structural abnormality with no seizures or epileptiform discharges seen throughout the recording. [facility] neurology workup. See their recommendations with MRI brain, CTA head/neck, 2D ECHO -MRI done and showed "No acute intracranial process. No evidence of acute or subacute infarct." -CTA Head/Neck done and showed "No intracranial large vessel occlusion or significant stenosis. No hemodynamically significant stenosis in the neck. Aortic Atherosclerosis." -ECHO done and Pending -Check Orthostatic VS and pending  -Will need PT/OT/SL to further Evaluate and Treat -Will Continue to Monitor on Telemetry -Check CXR and showed "There strandy opacities in both lung bases. The cardiomediastinal silhouette is within normal limits. Costophrenic angles are clear. 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 Arbutus Leas from presumed parkinson's like disease with Sinemet per neurology recommending continue home  doses of levodopa and carbidopa for now -Follows up with Dr. Arbutus Leas in outpatient setting   Essential Hypertension -Will  hold off on BP meds until after MRI brain to r/o stroke. -Continue monitor blood pressures per protocol -Blood pressure reading was improved at 127/77 however she was little bit soft on admission I dropped down to 94-56   DNR (do not resuscitate)/DNI(Do Not Intubate) -On 01-21-2023. Verified with pt that she wants to be DNR/DNI. Witnessed by her dtr(Karen Nguyen). Pt states she has the yellow DNR form at home.   Normocytic Anemia -Hgb/Hct Trend: Recent Labs  Lab 01/20/23 1456 01/21/23 1025  HGB 10.6* 10.2*  HCT 33.4* 31.9*  MCV 88.8 88.1  -Anemia panel was checked and showed an iron level of 34, UIBC 352, TIBC 386, saturation ratios of 9%, ferritin level 8, folate of 26.1, vitamin B12 664  Hypoalbuminemia -Patient's Albumin Trend: Recent Labs  Lab 01/21/23 1025  ALBUMIN 3.4*  -Continue to Monitor and Trend and repeat CMP in the AM  Will continue to monitor patient's clinical response to intervention and repeat blood work in the a.m. and follow-up on the studies that have been ordered.  Pending further clinical improvement and evaluation by PT and OT patient may be able to be discharged home for the next 24 hours.   DVT prophylaxis: heparin injection 5,000 Units Start: 01/21/23 0600 SCDs Start: 01/21/23 0045    Code Status: DNR Family Communication: Discussed with the patient's husband at bedside  Disposition Plan:  Level of care: Telemetry Medical Status is: Observation The patient remains OBS appropriate and will d/c before 2 midnights.    Consultants:  Neurology  Procedures:  As delineated as above  Antimicrobials:  Anti-infectives (From admission, onward)    None       Objective: Vitals:   01/21/23 0945 01/21/23 1000 01/21/23 1015 01/21/23 1140  BP: 96/74 (!) 97/55 127/77   Pulse: 73 65 76   Resp: 12 14 19    Temp:    98.1 F (36.7 C)  TempSrc:      SpO2: 97% 96% 95%   Weight:      Height:       No intake or output data in the 24 hours ending  01/21/23 1427 Filed Weights   01/20/23 1441  Weight: 58.1 kg   Data Reviewed: I have personally reviewed following labs and imaging studies  CBC: Recent Labs  Lab 01/20/23 1456 01/21/23 1025  WBC 7.2 7.2  NEUTROABS  --  4.7  HGB 10.6* 10.2*  HCT 33.4* 31.9*  MCV 88.8 88.1  PLT 307 269   Basic Metabolic Panel: Recent Labs  Lab 01/20/23 1456 01/21/23 1025  NA 136 139  K 3.6 3.8  CL 97* 100  CO2 26 26  GLUCOSE 117* 99  BUN 11 9  CREATININE 0.58 0.69  CALCIUM 9.5 9.7  MG  --  1.8  PHOS  --  3.3   GFR: Estimated Creatinine Clearance: 48.8 mL/min (by C-G formula based on SCr of 0.69 mg/dL). Liver Function Tests: Recent Labs  Lab 01/21/23 1025  AST 20  ALT <5  ALKPHOS 62  BILITOT 0.7  PROT 6.3*  ALBUMIN 3.4*   Recent Labs  Lab 01/20/23 1456  LIPASE 25   No results for input(s): "AMMONIA" in the last 168 hours. Coagulation Profile: No results for input(s): "INR", "PROTIME" in the last 168 hours. Cardiac Enzymes: No results for input(s): "CKTOTAL", "CKMB", "CKMBINDEX", "TROPONINI" in the last 168 hours. BNP (last 3  results) No results for input(s): "PROBNP" in the last 8760 hours. HbA1C: Recent Labs    01/20/23 1513  HGBA1C 5.2   CBG: Recent Labs  Lab 01/20/23 1446  GLUCAP 116*   Lipid Profile: Recent Labs    01/21/23 1025  CHOL 139  HDL 62  LDLCALC 64  TRIG 64  CHOLHDL 2.2   Thyroid Function Tests: No results for input(s): "TSH", "T4TOTAL", "FREET4", "T3FREE", "THYROIDAB" in the last 72 hours. Anemia Panel: Recent Labs    01/21/23 1025  VITAMINB12 664  FOLATE 26.1  FERRITIN 8*  TIBC 386  IRON 34  RETICCTPCT 0.9   Sepsis Labs: No results for input(s): "PROCALCITON", "LATICACIDVEN" in the last 168 hours.  No results found for this or any previous visit (from the past 240 hour(s)).   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 EEG to evaluate for seizure Level of alertness: Awake, asleep AEDs during EEG study: None Technical aspects: This EEG study was done with scalp electrodes positioned according to the 10-20 International system of electrode placement. Electrical activity was reviewed with band pass filter of 1-70Hz , sensitivity of 7 uV/mm, display speed of 26mm/sec with a 60Hz  notched filter applied as appropriate. EEG data were recorded continuously and digitally stored.  Video monitoring was available and reviewed as appropriate. Description: The posterior dominant rhythm consists of 13 Hz activity of moderate voltage (25-35 uV) seen predominantly in posterior head regions, symmetric and reactive to eye opening and eye closing. Sleep was characterized by vertex waves, sleep spindles (12 to 14 Hz), maximal frontocentral region. EEG showed continuous generalized 3 to 6 Hz theta-delta slowing in right hemisphere. Hyperventilation and photic stimulation were not performed.   ABNORMALITY - Continuous slow, right hemisphere IMPRESSION: This study is suggestive of cortical dysfunction arising from right hemisphere likely secondary to underlying structural abnormality. No seizures or epileptiform discharges were seen throughout the recording. Charlsie Quest   MR BRAIN WO CONTRAST  Result Date: 01/21/2023 CLINICAL DATA:  Syncopal episodes, nausea, dizziness, weakness EXAM: MRI HEAD WITHOUT CONTRAST TECHNIQUE: Multiplanar, multiecho pulse sequences of the brain and surrounding structures were obtained without intravenous contrast. COMPARISON:   11/10/2020 MRI head, correlation is also made with 01/20/2023 CT head FINDINGS: Brain: No restricted diffusion to suggest acute or subacute infarct. No acute hemorrhage, mass, mass effect, or midline shift. No hydrocephalus or extra-axial collection. Normal craniocervical junction. Punctate focus of hemosiderin deposition in the posterior right temporal lobe, likely sequela of hypertensive microhemorrhage. Scattered and confluent T2 hyperintense signal in the periventricular white matter, likely the sequela of moderate chronic small vessel ischemic disease. Mildly advanced cerebral atrophy for age. Vascular: Normal arterial flow voids. Skull and upper cervical spine: Normal marrow signal. Sinuses/Orbits: Clear paranasal sinuses. No acute finding in the orbits. Other: The mastoid air cells are well aerated. IMPRESSION: No acute intracranial process. No evidence of acute or subacute infarct. Electronically Signed   By: Wiliam Ke M.D.   On: 01/21/2023 02:14   CT ANGIO HEAD NECK W WO CM  Result Date: 01/20/2023 CLINICAL  DATA:  Syncope, nausea, dizziness, weakness EXAM: CT ANGIOGRAPHY HEAD AND NECK WITH AND WITHOUT CONTRAST TECHNIQUE: Multidetector CT imaging of the head and neck was performed using the standard protocol during bolus administration of intravenous contrast. Multiplanar CT image reconstructions and MIPs were obtained to evaluate the vascular anatomy. Carotid stenosis measurements (when applicable) are obtained utilizing NASCET criteria, using the distal internal carotid diameter as the denominator. RADIATION DOSE REDUCTION: This exam was performed according to the departmental dose-optimization program which includes automated exposure control, adjustment of the mA and/or kV according to patient size and/or use of iterative reconstruction technique. CONTRAST:  75mL OMNIPAQUE IOHEXOL 350 MG/ML SOLN COMPARISON:  No prior CTA available, correlation is made with 01/20/2023 CT head FINDINGS: CT HEAD  FINDINGS For noncontrast findings, please see same day CT head. CTA NECK FINDINGS Aortic arch: Two-vessel arch with a common origin of the brachiocephalic and left common carotid arteries. Imaged portion shows no evidence of aneurysm or dissection. No significant stenosis of the major arch vessel origins. Aortic atherosclerosis. Right carotid system: No evidence of dissection, occlusion, or hemodynamically significant stenosis (greater than 50%). Left carotid system: No evidence of dissection, occlusion, or hemodynamically significant stenosis (greater than 50%). Vertebral arteries: No evidence of dissection, occlusion, or hemodynamically significant stenosis (greater than 50%). Skeleton: No acute osseous abnormality. Degenerative changes in the cervical spine. Other neck: A small hypoenhancing foci in the thyroid, the largest of which measures up to 3 mm, Fortune 0 follow-up is indicated. (Reference: J Am Coll Radiol. 2015 Feb;12(2): 143-50) Upper chest: No focal pulmonary opacity or pleural effusion. Emphysema. Review of the MIP images confirms the above findings CTA HEAD FINDINGS Anterior circulation: Both internal carotid arteries are patent to the termini, without significant stenosis. A1 segments patent. Normal anterior communicating artery. Anterior cerebral arteries are patent to their distal aspects without significant stenosis. No M1 stenosis or occlusion. MCA branches perfused to their distal aspects without significant stenosis. Posterior circulation: Vertebral arteries patent to the vertebrobasilar junction without significant stenosis. Posterior inferior cerebellar arteries patent proximally. Basilar patent to its distal aspect without significant stenosis. Superior cerebellar arteries patent proximally. Patent P1 segments. Near fetal origin of the left PCA from the left posterior communicating artery. PCAs perfused to their distal aspects without significant stenosis. The bilateral posterior  communicating arteries are patent. Venous sinuses: Patent. Anatomic variants: Near fetal origin of the left PCA. Review of the MIP images confirms the above findings IMPRESSION: 1. No intracranial large vessel occlusion or significant stenosis. 2. No hemodynamically significant stenosis in the neck. 3. Aortic atherosclerosis. Aortic Atherosclerosis (ICD10-I70.0). Electronically Signed   By: Wiliam Ke M.D.   On: 01/20/2023 23:54   DG Chest 1 View  Result Date: 01/20/2023 CLINICAL DATA:  Cough EXAM: CHEST  1 VIEW COMPARISON:  None Available. FINDINGS: There strandy opacities in both lung bases. The cardiomediastinal silhouette is within normal limits. Costophrenic angles are clear. There is no pneumothorax or acute fracture. IMPRESSION: Strandy opacities in both lung bases, atelectasis versus infection. Electronically Signed   By: Darliss Cheney M.D.   On: 01/20/2023 23:13   CT HEAD WO CONTRAST ( )  Result Date: 01/20/2023 CLINICAL DATA:  Headache, new onset (Age >= 51y) weakness EXAM: CT HEAD WITHOUT CONTRAST TECHNIQUE: Contiguous axial images were obtained from the base of the skull through the vertex without intravenous contrast. RADIATION DOSE REDUCTION: This exam was performed according to the departmental dose-optimization program which includes automated exposure control, adjustment of the mA and/or kV according to  patient size and/or use of iterative reconstruction technique. COMPARISON:  None Available. FINDINGS: Brain: No evidence of acute infarction, hemorrhage, hydrocephalus, extra-axial collection or mass lesion/mass effect. Sequela of moderate to severe chronic microvascular ischemic change. Vascular: No hyperdense vessel or unexpected calcification. Skull: Normal. Negative for fracture or focal lesion. Sinuses/Orbits: No middle ear or mastoid effusion. Paranasal sinuses are clear. Orbits are unremarkable. Other: None. IMPRESSION: No acute intracranial abnormality. Sequela of moderate to  severe chronic microvascular ischemic change. Electronically Signed   By: Lorenza Cambridge M.D.   On: 01/20/2023 16:46    Scheduled Meds:  aspirin EC  81 mg Oral Daily   carbidopa-levodopa  1 tablet Oral QHS   carbidopa-levodopa  1.5 tablet Oral TID   heparin  5,000 Units Subcutaneous Q8H   pravastatin  20 mg Oral q1800   Continuous Infusions:  magnesium sulfate bolus IVPB      LOS: 0 days   Marguerita Merles, DO [facility] Available via Epic secure chat 7am-7pm After these hours, please refer to coverage provider listed on [email or web address] 01/21/2023, 2:27 PM

## 2023-01-21 NOTE — Evaluation (Signed)
Occupational Therapy Evaluation Patient Details Name: Emmaly Lockwood MRN: [id number] DOB: 04/20/1951 Today's Date: 01/21/2023   History of Present Illness Pt is a 72 y.o. female presenting 01/20/2023 with 4 syncopal episodes over the last two to three weeks in which she feels nauseous and passes out. MRI brain negative for acute findings. CXR with some atelectasis in the bases. EEG suggestive of cortical dysfunction arising from R hemisphere likely secondary to underlying structural abnormality; no seizures noted. Neurology suspecting cortical basal ganglionic degeneration. PMH significant for Parkinson's disease, HTN, constipation, dizziness, fibromyalgia, history of tobacco abuse, hyperlipidemia, osteoporosis, Raynaud's.   Clinical Impression   PTA, pt lived with husband who intermittently assisted with LB ADL and IADL. Aide to assist with bathing 2x/week. Upon eval, pt with decreased safety, balance, coordination, attention, and strength affecting safe participation in ADL. Pt performing transfers with up to mod A this session and highly distracted by lines/leads. Husband confident he can care for him at home and relatively knowledgeable about pt's PD symptoms and how they affect mobility. Recommending HHOT to optimize safety and independence in ADL and IADL.       If plan is discharge home, recommend the following: A little help with walking and/or transfers;A little help with bathing/dressing/bathroom;Assistance with cooking/housework;Assist for transportation;Help with stairs or ramp for entrance;Direct supervision/assist for medications management;Direct supervision/assist for financial management    Functional Status Assessment  Patient has had a recent decline in their functional status and demonstrates the ability to make significant improvements in function in a reasonable and predictable amount of time.  Equipment Recommendations  None recommended by OT    Recommendations for Other  Services       Precautions / Restrictions Precautions Precautions: Fall      Mobility Bed Mobility Overal bed mobility: Needs Assistance Bed Mobility: Supine to Sit, Sit to Supine     Supine to sit: Min assist Sit to supine: Min assist   General bed mobility comments: A to bring BLE into bed and for truncal elevation to come to EOB    Transfers Overall transfer level: Needs assistance Equipment used: 1 person hand held assist Transfers: Sit to/from Stand, Bed to chair/wheelchair/BSC Sit to Stand: Min assist, Mod assist Stand pivot transfers: Mod assist         General transfer comment: Initially mod A to rise progressing to min A with removal of some evnironmental distractions (BP cuff cord, etc).      Balance Overall balance assessment: Needs assistance Sitting-balance support: Single extremity supported, No upper extremity supported, Feet supported Sitting balance-Leahy Scale: Fair     Standing balance support: Bilateral upper extremity supported, Single extremity supported, During functional activity Standing balance-Leahy Scale: Poor Standing balance comment: reliant on external support or AD                           ADL either performed or assessed with clinical judgement   ADL Overall ADL's : Needs assistance/impaired Eating/Feeding: Set up;Bed level   Grooming: Set up;Sitting;Wash/dry face Grooming Details (indicate cue type and reason): with R hand; intention tremor noted Upper Body Bathing: Set up;Sitting   Lower Body Bathing: Moderate assistance;Sit to/from stand   Upper Body Dressing : Sitting;Minimal assistance   Lower Body Dressing: Moderate assistance;Sit to/from stand   Toilet Transfer: Moderate assistance;Minimal assistance;Stand-pivot Statistician Details (indicate cue type and reason): 1 person HHA         Functional mobility during ADLs: Moderate assistance  Vision Ability to See in Adequate Light: 0  Adequate Patient Visual Report: No change from baseline Additional Comments: NT formally     Perception Perception:  (Pt with bradykinesia and small body movements and needing cues ofr BIG movement)       Praxis         Pertinent Vitals/Pain Pain Assessment Pain Assessment: Faces Faces Pain Scale: No hurt Pain Intervention(s): Limited activity within patient's tolerance, Monitored during session     Extremity/Trunk Assessment Upper Extremity Assessment Upper Extremity Assessment: Generalized weakness;LUE deficits/detail;RUE deficits/detail RUE Deficits / Details: intention tremor noted. Decr coordination bilaterally L>R LUE Deficits / Details: L>R intention tremor and weakness. Decr coordination bilaterally L>R   Lower Extremity Assessment Lower Extremity Assessment: Defer to PT evaluation       Communication Communication Communication: No apparent difficulties   Cognition Arousal: Alert Behavior During Therapy: WFL for tasks assessed/performed Overall Cognitive Status: Impaired/Different from baseline Area of Impairment: Attention, Following commands, Safety/judgement, Awareness, Problem solving                   Current Attention Level: Sustained (difficulty with selective attention and highly distracted by lines/wires. Poor ability to demo set switching from one task to another as well.)   Following Commands: Follows one step commands consistently, Follows one step commands with increased time Safety/Judgement: Decreased awareness of safety Awareness: Emergent (awareness of typical abilities is good. Anticipating difficulties with change in functional status more difificult) Problem Solving: Slow processing, Requires verbal cues General Comments: needing up to mod cues     General Comments  VSS    Exercises     Shoulder Instructions      Home Living Family/patient expects to be discharged to:: Private residence Living Arrangements: Spouse/significant  other Available Help at Discharge: Family;Available 24 hours/day Type of Home: House Home Access: Stairs to enter     Home Layout: One level     Bathroom Shower/Tub: Chief Strategy Officer: Handicapped height     Home Equipment: Educational psychologist (4 wheels);BSC/3in1;Lift chair;Wheelchair - manual          Prior Functioning/Environment Prior Level of Function : Needs assist             Mobility Comments: rollator in the home.  can get OOb and to the BR/toilet without assist. ADLs Comments: Aide into the home 2x/wk for bath. Husband intermittently assisting with LB ADL and morning routine esp on days with appts        OT Problem List: Decreased strength;Decreased activity tolerance;Decreased coordination;Impaired balance (sitting and/or standing);Decreased cognition;Decreased safety awareness;Decreased knowledge of use of DME or AE;Decreased knowledge of precautions;Impaired UE functional use      OT Treatment/Interventions: Therapeutic exercise;Self-care/ADL training;DME and/or AE instruction;Balance training;Patient/family education;Therapeutic activities;Cognitive remediation/compensation    OT Goals(Current goals can be found in the care plan section) Acute Rehab OT Goals Patient Stated Goal: get home OT Goal Formulation: With patient Time For Goal Achievement: 02/04/23 Potential to Achieve Goals: Good  OT Frequency: Min 2X/week    Co-evaluation              AM-PAC OT "6 Clicks" Daily Activity     Outcome Measure Help from another person eating meals?: None Help from another person taking care of personal grooming?: A Little Help from another person toileting, which includes using toliet, bedpan, or urinal?: A Lot Help from another person bathing (including washing, rinsing, drying)?: A Lot Help from another person to put on and taking  off regular upper body clothing?: A Little Help from another person to put on and taking off regular lower  body clothing?: A Lot 6 Click Score: 16   End of Session Equipment Utilized During Treatment: Gait belt Nurse Communication: Mobility status  Activity Tolerance: Patient tolerated treatment well Patient left: in bed;with call bell/phone within reach  OT Visit Diagnosis: Unsteadiness on feet (R26.81);Muscle weakness (generalized) (M62.81);Other symptoms and signs involving cognitive function;History of falling (Z91.81);Repeated falls (R29.6)                Time: 1456-1540 OT Time Calculation (min): 44 min Charges:  OT General Charges $OT Visit: 1 Visit OT Evaluation $OT Eval Moderate Complexity: 1 Mod OT Treatments $Self Care/Home Management : 23-37 mins  Tyler Deis, OTR/L [facility] Acute Rehabilitation Office: [phone]   Myrla Halsted 01/21/2023, 6:21 PM

## 2023-01-21 NOTE — H&P (Signed)
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 Arbutus Leas with neurology, hypertension who presents to the ER today with repeated bouts of seizure-like activity.  The daughter Mel Almond states that the patient will have episode where she will get very dizzy, gets nauseated, sometimes will vomit.  Soon after she loses consciousness for several minutes.  When she wakes up, she sleeps about 4 to 6 hours.  She is then very unsteady on her feet and dizzy.  She wears adult diapers most the time.  Daughter is not aware of any urinary continence since she is wearing diapers.  Patient lives with her husband.  She has a caretaker.  Daughter lives in [place].  This happened about 4 times the last several months.  She has frequent falls.  She is not on any anticoagulants.  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  On arrival to the ER, temp 98.1 heart rate 69 blood pressure 121/61 satting 99% on room air.  UA negative Sodium 136, potassium 3.6, chloride 97, bicarb 26, BUN of 11, creatinine 0.58  Lipase 25  White count 7.2, hemoglobin 10.6, platelets of 307  CT the head was negative for acute intracranial abnormality.  There is moderate to severe chronic small vessel ischemic changes. CT angio head and neck negative for LVO. Chest x-ray shows some atelectasis in the bases.  Neurology has been consulted.  EEG is started.  [facility] hospital[facility] consulted.   ED Course: CT head negative. CTA head/neg negative for LVO. Neurology consulted  Review of Systems:  Review of Systems  Constitutional: Negative.   HENT: Negative.    Eyes: Negative.    Respiratory: Negative.    Cardiovascular: Negative.   Gastrointestinal:  Positive for heartburn and vomiting.  Genitourinary: Negative.   Musculoskeletal:  Positive for falls.  Skin: Negative.   Neurological:  Positive for loss of consciousness.  Endo/Heme/Allergies: Negative.   Psychiatric/Behavioral: Negative.    All other systems reviewed and are negative.   Past Medical History:  Diagnosis Date   ACP (advance care planning)    Allergic rhinitis    Balance disorder    Constipation    Coordination of complex care    Dizziness    Encounter for preventive care    Essential tremor    Fall as cause of accidental injury at home as place of occurrence    Fibromyalgia    Hearing difficulty    Herpes zoster    History of cigarette smoking    Hot flashes    HTN (hypertension)    Hyperlipidemia    Inguinal hernia    Left ankle injury    Left shoulder pain    Long term use of drug    Memory changes    Occipital neuritis    Osteoarthritis of both hands    Osteoporosis    Raynaud's disease    Scoliosis    Status post inguinal hernia repair    Subcutaneous mass    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 She has never used smokeless tobacco. She reports that she does not drink alcohol and does not use drugs.  Allergies  Allergen Reactions   Codeine Phosphate [Codeine] Nausea And Vomiting   Latex Hives   Sulfur Rash   Lyrica Cr [Pregabalin Er]     Unknown reaction   Oxycodone Rash    Rash on roof of mouth    Family History  Problem Relation Age of Onset   Dementia Other    Breast cancer Other    Cancer Other    Emphysema Mother    Lung cancer Father    Dementia Sister    COPD Brother    Diabetes Daughter     Prior to Admission medications   Medication Sig Start Date End Date  Taking? Authorizing Provider  aspirin 81 MG EC tablet Take 81 mg by mouth daily.   Yes [provider]  Calcium Carb-Cholecalciferol (OS-CAL EXTRA D3) 500-600 MG-UNIT TABS Take by mouth.   Yes [provider]  carbidopa-levodopa (SINEMET CR) 50-200 MG tablet TAKE 1 TABLET BY MOUTH AT BEDTIME 01/12/23  Yes Tat, Rebecca S, DO  carbidopa-levodopa (SINEMET IR) 25-100 MG tablet TAKE 1 & 1/2 TABLETS BY MOUTH AT 7AM, 11AM & 4PM 01/13/23  Yes Tat, Octaviano Batty, DO  ibuprofen (ADVIL) 100 MG/5ML suspension Take 200 mg by mouth every 4 (four) hours as needed.   Yes [provider]  losartan (COZAAR) 50 MG tablet Take 1 tablet by mouth daily. 08/26/19  Yes [provider]  Multiple Vitamins-Minerals (EMERGEN-C VITAMIN C) PACK Take 1 packet by mouth daily. 09/05/19  Yes [provider]  pravastatin (PRAVACHOL) 20 MG tablet Take 20 mg by mouth daily. Take 1 tablet by mouth every day for cholesterol   Yes [provider]  tiZANidine (ZANAFLEX) 2 MG tablet Take 1-2 tabs twice daily as needed for pain, insomnia. 08/23/19  Yes [provider]  AMBULATORY NON FORMULARY MEDICATION Walker:  2 wheels, 2 tennis balls Dx: G20 08/29/21   Vladimir Faster, DO    Physical Exam: Vitals:   01/20/23 2004 01/20/23 2030 01/20/23 2315 01/20/23 2330  BP: (!) 124/100 (!) 147/72 133/75 (!) 148/73  Pulse: 82 79 73 75  Resp: 18 19 15 15   Temp: 98.1 F (36.7 C)     TempSrc: Oral     SpO2: 100% 98% 97% 97%  Weight:      Height:        Physical Exam Vitals and nursing note reviewed.  Constitutional:      General: She is not in acute distress.    Appearance: She is normal weight. She is not toxic-appearing or diaphoretic.  HENT:     Head: Normocephalic and atraumatic.     Nose: Nose normal.  Eyes:     General: No scleral icterus. Cardiovascular:     Rate and Rhythm: Normal rate and regular rhythm.  Pulmonary:     Effort: Pulmonary effort is normal.     Breath  sounds: Normal breath sounds.  Abdominal:     General: Abdomen is flat. Bowel sounds are normal. There is no distension.     Palpations: Abdomen is soft.  Musculoskeletal:     Right lower leg: No edema.     Left lower leg: Edema present.  Skin:    General: Skin is warm and dry.     Capillary Refill: Capillary refill takes less  than 2 seconds.  Neurological:     General: No focal deficit present.     Mental Status: She is alert. She is disoriented.     Comments: Knows place. "Miranda" Thought year was 52 Knows her dtr Ronni  +resting tremor of right hand      Labs on Admission: I have personally reviewed following labs and imaging studies  CBC: Recent Labs  Lab 01/20/23 1456  WBC 7.2  HGB 10.6*  HCT 33.4*  MCV 88.8  PLT 307   Basic Metabolic Panel: Recent Labs  Lab 01/20/23 1456  NA 136  K 3.6  CL 97*  CO2 26  GLUCOSE 117*  BUN 11  CREATININE 0.58  CALCIUM 9.5   GFR: Estimated Creatinine Clearance: 48.8 mL/min (by C-G formula based on SCr of 0.58 mg/dL).  Recent Labs  Lab 01/20/23 1456  LIPASE 25   CBG: Recent Labs  Lab 01/20/23 1446  GLUCAP 116*   Urine analysis:    Component Value Date/Time   COLORURINE STRAW (A) 01/20/2023 2159   APPEARANCEUR CLEAR 01/20/2023 2159   LABSPEC 1.005 01/20/2023 2159   PHURINE 7.0 01/20/2023 2159   GLUCOSEU NEGATIVE 01/20/2023 2159   HGBUR NEGATIVE 01/20/2023 2159   BILIRUBINUR NEGATIVE 01/20/2023 2159   KETONESUR NEGATIVE 01/20/2023 2159   PROTEINUR NEGATIVE 01/20/2023 2159   NITRITE NEGATIVE 01/20/2023 2159   LEUKOCYTESUR NEGATIVE 01/20/2023 2159    Radiological Exams on Admission: I have personally reviewed images CT ANGIO HEAD NECK W WO CM  Result Date: 01/20/2023 CLINICAL DATA:  Syncope, nausea, dizziness, weakness EXAM: CT ANGIOGRAPHY HEAD AND NECK WITH AND WITHOUT CONTRAST TECHNIQUE: Multidetector CT imaging of the head and neck was performed using the standard protocol during bolus  administration of intravenous contrast. Multiplanar CT image reconstructions and MIPs were obtained to evaluate the vascular anatomy. Carotid stenosis measurements (when applicable) are obtained utilizing NASCET criteria, using the distal internal carotid diameter as the denominator. RADIATION DOSE REDUCTION: This exam was performed according to the departmental dose-optimization program which includes automated exposure control, adjustment of the mA and/or kV according to patient size and/or use of iterative reconstruction technique. CONTRAST:  75mL OMNIPAQUE IOHEXOL 350 MG/ML SOLN COMPARISON:  No prior CTA available, correlation is made with 01/20/2023 CT head FINDINGS: CT HEAD FINDINGS For noncontrast findings, please see same day CT head. CTA NECK FINDINGS Aortic arch: Two-vessel arch with a common origin of the brachiocephalic and left common carotid arteries. Imaged portion shows no evidence of aneurysm or dissection. No significant stenosis of the major arch vessel origins. Aortic atherosclerosis. Right carotid system: No evidence of dissection, occlusion, or hemodynamically significant stenosis (greater than 50%). Left carotid system: No evidence of dissection, occlusion, or hemodynamically significant stenosis (greater than 50%). Vertebral arteries: No evidence of dissection, occlusion, or hemodynamically significant stenosis (greater than 50%). Skeleton: No acute osseous abnormality. Degenerative changes in the cervical spine. Other neck: A small hypoenhancing foci in the thyroid, the largest of which measures up to 3 mm, Fortune 0 follow-up is indicated. (Reference: J Am Coll Radiol. 2015 Feb;12(2): 143-50) Upper chest: No focal pulmonary opacity or pleural effusion. Emphysema. Review of the MIP images confirms the above findings CTA HEAD FINDINGS Anterior circulation: Both internal carotid arteries are patent to the termini, without significant stenosis. A1 segments patent. Normal anterior communicating  artery. Anterior cerebral arteries are patent to their distal aspects without significant stenosis. No M1 stenosis or occlusion. MCA branches perfused to their distal aspects without significant stenosis. Posterior circulation:  Vertebral arteries patent to the vertebrobasilar junction without significant stenosis. Posterior inferior cerebellar arteries patent proximally. Basilar patent to its distal aspect without significant stenosis. Superior cerebellar arteries patent proximally. Patent P1 segments. Near fetal origin of the left PCA from the left posterior communicating artery. PCAs perfused to their distal aspects without significant stenosis. The bilateral posterior communicating arteries are patent. Venous sinuses: Patent. Anatomic variants: Near fetal origin of the left PCA. Review of the MIP images confirms the above findings IMPRESSION: 1. No intracranial large vessel occlusion or significant stenosis. 2. No hemodynamically significant stenosis in the neck. 3. Aortic atherosclerosis. Aortic Atherosclerosis (ICD10-I70.0). Electronically Signed   By: Wiliam Ke M.D.   On: 01/20/2023 23:54   DG Chest 1 View  Result Date: 01/20/2023 CLINICAL DATA:  Cough EXAM: CHEST  1 VIEW COMPARISON:  None Available. FINDINGS: There strandy opacities in both lung bases. The cardiomediastinal silhouette is within normal limits. Costophrenic angles are clear. There is no pneumothorax or acute fracture. IMPRESSION: Strandy opacities in both lung bases, atelectasis versus infection. Electronically Signed   By: Darliss Cheney M.D.   On: 01/20/2023 23:13   CT HEAD WO CONTRAST ( )  Result Date: 01/20/2023 CLINICAL DATA:  Headache, new onset (Age >= 51y) weakness EXAM: CT HEAD WITHOUT CONTRAST TECHNIQUE: Contiguous axial images were obtained from the base of the skull through the vertex without intravenous contrast. RADIATION DOSE REDUCTION: This exam was performed according to the departmental dose-optimization program  which includes automated exposure control, adjustment of the mA and/or kV according to patient size and/or use of iterative reconstruction technique. COMPARISON:  None Available. FINDINGS: Brain: No evidence of acute infarction, hemorrhage, hydrocephalus, extra-axial collection or mass lesion/mass effect. Sequela of moderate to severe chronic microvascular ischemic change. Vascular: No hyperdense vessel or unexpected calcification. Skull: Normal. Negative for fracture or focal lesion. Sinuses/Orbits: No middle ear or mastoid effusion. Paranasal sinuses are clear. Orbits are unremarkable. Other: None. IMPRESSION: No acute intracranial abnormality. Sequela of moderate to severe chronic microvascular ischemic change. Electronically Signed   By: Lorenza Cambridge M.D.   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 Seen by neurology. Undergoing EEG. [facility] neurology workup. 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  Essential hypertension Will hold off on BP meds until after MRI brain to r/o stroke.  DNR (do not resuscitate)/DNI(Do Not Intubate) 01-21-2023. Verified with pt that she wants to be DNR/DNI. Witnessed by her dtr(Ronni [place]). Pt states she has the yellow DNR form at home.  Normocytic anemia Check iron profile.   DVT prophylaxis: SQ Heparin Code Status: DNR/DNI(Do NOT Intubate). Verified with pt. Witnessed by dtr ronni Family Communication: discussed with pt and  dtr ronni  Disposition Plan: return home  Consults called: EDP has consulted neurology  Admission status: Observation, Telemetry bed   Carollee Herter, DO [facility] 01/21/2023, 12:30 AM

## 2023-01-21 NOTE — Evaluation (Signed)
Physical Therapy Evaluation Patient Details Name: Karen Nguyen MRN: [id number] DOB: 1951-03-14 Today's Date: 01/21/2023  History of Present Illness  Pt is a 72 y.o. female presenting 01/20/2023 with 4 syncopal episodes over the last two to three weeks in which she feels nauseous and passes out. MRI brain negative for acute findings. CXR with some atelectasis in the bases. EEG suggestive of cortical dysfunction arising from R hemisphere likely secondary to underlying structural abnormality; no seizures noted. Neurology suspecting cortical basal ganglionic degeneration. PMH significant for Parkinson's disease, HTN, constipation, dizziness, fibromyalgia, history of tobacco abuse, hyperlipidemia, osteoporosis, Raynaud's.  Clinical Impression  Pt admitted with/for seizure like activity with a post ictal liik state.  Pt needing mod assist initially for basic mobility due to general weakness and anxiety.  Pt currently limited functionally due to the problems listed below.  (see problems list.)  Pt will benefit from PT to maximize function and safety to be able to get home safely with available assist.         If plan is discharge home, recommend the following: A little help with walking and/or transfers;A little help with bathing/dressing/bathroom;Assistance with cooking/housework;Direct supervision/assist for medications management;Direct supervision/assist for financial management;Assist for transportation;Help with stairs or ramp for entrance   Can travel by private vehicle        Equipment Recommendations None recommended by PT  Recommendations for Other Services       Functional Status Assessment Patient has had a recent decline in their functional status and/or demonstrates limited ability to make significant improvements in function in a reasonable and predictable amount of time     Precautions / Restrictions Precautions Precautions: Fall      Mobility  Bed Mobility Overal bed  mobility: Needs Assistance Bed Mobility: Sit to Supine       Sit to supine: Min assist   General bed mobility comments: pt need assist with legs back into bed.    Transfers Overall transfer level: Needs assistance   Transfers: Sit to/from Stand, Bed to chair/wheelchair/BSC Sit to Stand: Mod assist Stand pivot transfers: Mod assist              Ambulation/Gait               General Gait Details: pivotal steps only  Stairs            Wheelchair Mobility     Tilt Bed    Modified Rankin (Stroke Patients Only)       Balance Overall balance assessment: Needs assistance Sitting-balance support: Single extremity supported, No upper extremity supported, Feet supported Sitting balance-Leahy Scale: Fair     Standing balance support: Bilateral upper extremity supported, Single extremity supported, During functional activity Standing balance-Leahy Scale: Poor Standing balance comment: reliant on external support or AD                             Pertinent Vitals/Pain Pain Assessment Pain Assessment: Faces Faces Pain Scale: No hurt Pain Intervention(s): Monitored during session    Home Living Family/patient expects to be discharged to:: Private residence Living Arrangements: Spouse/significant other Available Help at Discharge: Family;Available 24 hours/day Type of Home: House Home Access: Stairs to enter       Home Layout: One level Home Equipment: Educational psychologist (4 wheels);BSC/3in1;Lift chair;Wheelchair - manual      Prior Function Prior Level of Function : Needs assist  Mobility Comments: rollator in the home.  can get OOb and to the BR/toilet without assist. ADLs Comments: Aide into the home 2x/wk for bath.     Extremity/Trunk Assessment   Upper Extremity Assessment Upper Extremity Assessment: Defer to OT evaluation    Lower Extremity Assessment Lower Extremity Assessment: Generalized weakness        Communication   Communication Communication: No apparent difficulties  Cognition Arousal: Alert Behavior During Therapy: WFL for tasks assessed/performed Overall Cognitive Status:  (NT formally)                                          General Comments General comments (skin integrity, edema, etc.): VSS during activity    Exercises     Assessment/Plan    PT Assessment Patient needs continued PT services  PT Problem List Decreased strength;Decreased activity tolerance;Decreased balance;Decreased mobility;Decreased coordination;Decreased knowledge of use of DME       PT Treatment Interventions Gait training;Functional mobility training;Therapeutic activities;Balance training;Neuromuscular re-education;Patient/family education;DME instruction;Stair training    PT Goals (Current goals can be found in the Care Plan section)  Acute Rehab PT Goals Patient Stated Goal: build back strength/  PLOF PT Goal Formulation: With patient Time For Goal Achievement: 02/04/23 Potential to Achieve Goals: Good    Frequency Min 1X/week     Co-evaluation               AM-PAC PT "6 Clicks" Mobility  Outcome Measure Help needed turning from your back to your side while in a flat bed without using bedrails?: A Lot Help needed moving from lying on your back to sitting on the side of a flat bed without using bedrails?: A Lot Help needed moving to and from a bed to a chair (including a wheelchair)?: A Lot Help needed standing up from a chair using your arms (e.g., wheelchair or bedside chair)?: A Little Help needed to walk in hospital room?: A Little Help needed climbing 3-5 steps with a railing? : A Lot 6 Click Score: 14    End of Session   Activity Tolerance: Patient tolerated treatment well;Other (comment) (anxiety limiting) Patient left: in bed;with call bell/phone within reach;with family/visitor present Nurse Communication: Mobility status PT Visit Diagnosis:  Unsteadiness on feet (R26.81);Muscle weakness (generalized) (M62.81);Other symptoms and signs involving the nervous system (R29.898)    Time: 4098-1191 PT Time Calculation (min) (ACUTE ONLY): 21 min   Charges:   PT Evaluation $PT Eval Moderate Complexity: 1 Mod   PT General Charges $$ ACUTE PT VISIT: 1 Visit         01/21/2023  Jacinto Halim., PT Acute Rehabilitation Services [phone]  (office)  Eliseo Gum Amore Ackman 01/21/2023, 4:05 PM

## 2023-01-21 NOTE — Hospital Course (Addendum)
The patient is a 72 year old Caucasian female with a past medical history significant for but unable to possible cortical basal ganglionic degeneration was followed by Dr. Arbutus Leas of neurology, hypertension as well as other comorbidities including but not limited to constipation, dizziness, fibromyalgia, history of tobacco abuse, hyperlipidemia, osteoporosis, Raynaud's who presented to the hospital for seizure-like spells.  The patient's daughter had stated the patient will have episodes where she will get very dizzy gets nauseous and will sometimes vomit and then shortly after she loses consciousness for several minutes and then when she wakes up she sleeps for about 4 to 6 hours and is very unsteady on her feet and dizzy.  She wears adult diapers most of the time and not is not aware of any urinary continence since she is wearing diapers.  Patient states this happened about 4 times in the last several months and has been been happening more frequently.  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  On arrival to the ED she had a normal temperature and heart rate of 69 blood pressure 120/61 and she was saturating well on room air.  She was worked up and had a CT head that was negative for any acute intracranial abnormality but there was moderate to severe chronic small vessel ischemic changes and CT angio of the head and neck was negative for LVO.  Chest x-ray showed some atelectasis at the bases and EEG was being obtained and neurology been consulted.  EEG done and showed that the study was suggestive of cortical dysfunction arising from the right hemisphere likely secondary to underlying structural abnormality with no seizures or epileptiform discharges seen throughout the recording.  Neurology also recommended further workup with an MRI of the brain without contrast and would not pursue stroke or TIA risk factor workup unless MRI is  positive for stroke  Thus recommended a 2D echocardiogram as well as orthostatic vital signs, monitoring on telemetry as well as obtain a chest x-ray and urinalysis.  Neurology recommending also obtain PT and OT to further evaluate and treat and they feel that if the results are unremarkable she will need outpatient neurology follow-up.  Workup was relatively unremarkable neurology cleared the patient for discharge and she will need to follow-up with PCP and neurology outpatient setting  Assessment and Plan:  Seizure-like activity (HCC) with dizziness spells and passing out spells with periods of unresponsiveness and hypersomnia preceded by nausea vomiting, improved and felt to be cognitive fluctuations in the setting of Parkinsonian syndrome -Placed Observation med/tele bed.  -Seen by neurology.  -Undergoing EE and it showed that the study was suggestive of cortical dysfunction arising from the right hemisphere likely secondary to underlying structural abnormality with no seizures or epileptiform discharges seen throughout the recording. [facility] neurology workup. See their recommendations with MRI brain, CTA head/neck, 2D ECHO -MRI done and showed "No acute intracranial process. No evidence of acute or subacute infarct." -CTA Head/Neck done and showed "No intracranial large vessel occlusion or significant stenosis. No hemodynamically significant stenosis in the neck. Aortic Atherosclerosis." -ECHO done and showed an EF of 65 to 70% and showed The left ventricle has no regional wall motion abnormalities. Left ventricular diastolic parameters are consistent with Grade I diastolic  dysfunction (impaired relaxation).  -Check Orthostatic VS done and she was not orthostatic but will send her out with TED hose just in case. -Will need PT/OT/SL to further Evaluate and Treat and they  are recommending home health and have arranged -Will Continue to Monitor on Telemetry -Check CXR and showed "There strandy  opacities in both lung bases. The cardiomediastinal silhouette is within normal limits. Costophrenic angles are clear. There is no pneumothorax or acute fracture" and U/A Negative -She had LTM done and showed "This study is suggestive of cortical dysfunction arising from right hemisphere likely secondary to underlying structural abnormality. No seizures or epileptiform discharges were seen throughout the recording." -Neurology has cleared the patient for discharge and she will need to follow-up with Dr. Arbutus Leas and LTM was discontinued -Urine unremarkable and no cardiac abnormality seen.  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 Arbutus Leas from presumed parkinson's like disease with Sinemet per neurology recommending continue home doses of levodopa and carbidopa for now -Follows up with Dr. Arbutus Leas in outpatient setting within 1 week   Essential Hypertension -Will hold off on BP meds until after MRI brain to r/o stroke. -Continue monitor blood pressures per protocol -Blood pressure reading was improved at 127/77 however she was little soft but is now stable  Hypokalemia -Patient's K+ Level Trend: Recent Labs  Lab 01/20/23 1456 01/21/23 1025 01/22/23 0232  K 3.6 3.8 3.2*  -Replete with po Kcl 40 mEQ BID x2 -Continue to Monitor and Replete as Necessary -Repeat CMP within 1 week   DNR (do not resuscitate)/DNI(Do Not Intubate) -On 01-21-2023. Verified with pt that she wants to be DNR/DNI. Witnessed by her dtr(Ronni Coldwater). Pt states she has the yellow DNR form at home.   Normocytic Anemia -Hgb/Hct Trend: Recent Labs  Lab 01/20/23 1456 01/21/23 1025 01/22/23 0232  HGB 10.6* 10.2* 10.1*  HCT 33.4* 31.9* 31.2*  MCV 88.8 88.1 87.2  -Anemia panel was checked and showed an iron level of 34, UIBC 352, TIBC 386, saturation ratios of 9%, ferritin level 8, folate of 26.1, vitamin B12 664 -Continue to monitor  for signs and symptoms bleeding; no overt bleeding noted and will need to repeat CBC within 1 week  Hypoalbuminemia -Patient's Albumin Trend: Recent Labs  Lab 01/21/23 1025 01/22/23 0232  ALBUMIN 3.4* 3.3*  -Continue to Monitor and Trend and repeat CMP within 1 week

## 2023-01-22 DIAGNOSIS — Z66 Do not resuscitate: Secondary | ICD-10-CM | POA: Diagnosis not present

## 2023-01-22 DIAGNOSIS — R569 Unspecified convulsions: Secondary | ICD-10-CM | POA: Diagnosis not present

## 2023-01-22 DIAGNOSIS — R55 Syncope and collapse: Secondary | ICD-10-CM

## 2023-01-22 DIAGNOSIS — I10 Essential (primary) hypertension: Secondary | ICD-10-CM | POA: Diagnosis not present

## 2023-01-22 DIAGNOSIS — R29818 Other symptoms and signs involving the nervous system: Secondary | ICD-10-CM | POA: Diagnosis not present

## 2023-01-22 LAB — COMPREHENSIVE METABOLIC PANEL
ALT: 5 U/L (ref 0–44)
AST: 18 U/L (ref 15–41)
Albumin: 3.3 g/dL — ABNORMAL LOW (ref 3.5–5.0)
Alkaline Phosphatase: 64 U/L (ref 38–126)
Anion gap: 8 (ref 5–15)
BUN: 8 mg/dL (ref 8–23)
CO2: 25 mmol/L (ref 22–32)
Calcium: 9 mg/dL (ref 8.9–10.3)
Chloride: 104 mmol/L (ref 98–111)
Creatinine, Ser: 0.55 mg/dL (ref 0.44–1.00)
GFR, Estimated: >60 mL/min (ref >60)
Glucose, Bld: 92 mg/dL (ref 70–99)
Potassium: 3.2 mmol/L — ABNORMAL LOW (ref 3.5–5.1)
Sodium: 137 mmol/L (ref 135–145)
Total Bilirubin: 0.6 mg/dL (ref 0.3–1.2)
Total Protein: 5.9 g/dL — ABNORMAL LOW (ref 6.5–8.1)

## 2023-01-22 LAB — CBC WITH DIFFERENTIAL/PLATELET
Abs Immature Granulocytes: 0.01 10*3/uL (ref 0.00–0.07)
Basophils Absolute: 0 10*3/uL (ref 0.0–0.1)
Basophils Relative: 1 %
Eosinophils Absolute: 0.1 10*3/uL (ref 0.0–0.5)
Eosinophils Relative: 2 %
HCT: 31.2 % — ABNORMAL LOW (ref 36.0–46.0)
Hemoglobin: 10.1 g/dL — ABNORMAL LOW (ref 12.0–15.0)
Immature Granulocytes: 0 %
Lymphocytes Relative: 31 %
Lymphs Abs: 1.7 10*3/uL (ref 0.7–4.0)
MCH: 28.2 pg (ref 26.0–34.0)
MCHC: 32.4 g/dL (ref 30.0–36.0)
MCV: 87.2 fL (ref 80.0–100.0)
Monocytes Absolute: 0.6 10*3/uL (ref 0.1–1.0)
Monocytes Relative: 11 %
Neutro Abs: 3 10*3/uL (ref 1.7–7.7)
Neutrophils Relative %: 55 %
Platelets: 301 10*3/uL (ref 150–400)
RBC: 3.58 MIL/uL — ABNORMAL LOW (ref 3.87–5.11)
RDW: 14.6 % (ref 11.5–15.5)
WBC: 5.5 10*3/uL (ref 4.0–10.5)
nRBC: 0 % (ref 0.0–0.2)

## 2023-01-22 LAB — MAGNESIUM: Magnesium: 2.1 mg/dL (ref 1.7–2.4)

## 2023-01-22 LAB — TSH: TSH: 0.734 u[IU]/mL (ref 0.350–4.500)

## 2023-01-22 LAB — PHOSPHORUS: Phosphorus: 2.9 mg/dL (ref 2.5–4.6)

## 2023-01-22 MED ORDER — ACETAMINOPHEN 325 MG PO TABS: 650.0000 mg | ORAL_TABLET | Freq: Four times a day (QID) | ORAL | 0 refills | Status: DC | PRN

## 2023-01-22 MED ORDER — LOSARTAN POTASSIUM 25 MG PO TABS: 25.0000 mg | ORAL_TABLET | Freq: Every day | ORAL | 0 refills | Status: DC

## 2023-01-22 MED ORDER — POTASSIUM CHLORIDE CRYS ER 20 MEQ PO TBCR
40.0000 meq | EXTENDED_RELEASE_TABLET | Freq: Two times a day (BID) | ORAL | Status: DC
  Administered 2023-01-22: 40 meq via ORAL
  Filled 2023-01-22: qty 2

## 2023-01-22 MED ORDER — ONDANSETRON HCL 4 MG PO TABS: 4.0000 mg | ORAL_TABLET | Freq: Four times a day (QID) | ORAL | 0 refills | Status: DC | PRN

## 2023-01-22 NOTE — Plan of Care (Signed)
Patient discharged home with home health. AVS printed and reviewed with patient and husband. Patient transported via wheelchair to the main entrance with volunteer services.  Problem: Education: Goal: Knowledge of General Education information will improve Description: Including pain rating scale, medication(s)/side effects and non-pharmacologic comfort measures Outcome: Adequate for Discharge   Problem: Health Behavior/Discharge Planning: Goal: Ability to manage health-related needs will improve Outcome: Adequate for Discharge   Problem: Clinical Measurements: Goal: Ability to maintain clinical measurements within normal limits will improve Outcome: Adequate for Discharge Goal: Will remain free from infection Outcome: Adequate for Discharge Goal: Diagnostic test results will improve Outcome: Adequate for Discharge Goal: Respiratory complications will improve Outcome: Adequate for Discharge Goal: Cardiovascular complication will be avoided Outcome: Adequate for Discharge   Problem: Activity: Goal: Risk for activity intolerance will decrease Outcome: Adequate for Discharge   Problem: Nutrition: Goal: Adequate nutrition will be maintained Outcome: Adequate for Discharge   Problem: Coping: Goal: Level of anxiety will decrease Outcome: Adequate for Discharge   Problem: Elimination: Goal: Will not experience complications related to bowel motility Outcome: Adequate for Discharge Goal: Will not experience complications related to urinary retention Outcome: Adequate for Discharge   Problem: Pain Managment: Goal: General experience of comfort will improve Outcome: Adequate for Discharge   Problem: Safety: Goal: Ability to remain free from injury will improve Outcome: Adequate for Discharge   Problem: Skin Integrity: Goal: Risk for impaired skin integrity will decrease Outcome: Adequate for Discharge

## 2023-01-22 NOTE — Discharge Summary (Signed)
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 [facility] Course: The patient is a 72 year old Caucasian female with a past medical history significant for but unable to possible cortical basal ganglionic degeneration was followed by Dr. Arbutus Leas of neurology, hypertension as well as other comorbidities including but not limited to constipation, dizziness, fibromyalgia, history of tobacco abuse, hyperlipidemia, osteoporosis, Raynaud's who presented to the hospital for seizure-like spells.  The patient's daughter had stated the patient will have episodes where she will get very dizzy gets nauseous and will sometimes vomit and then shortly after she loses consciousness for several minutes and then when she wakes up she sleeps for about 4 to 6 hours and is very unsteady on her feet and dizzy.  She wears adult diapers most of the time and not is not aware of any urinary continence since she is wearing diapers.  Patient states this happened about 4 times in the last several months and has been been happening more frequently.  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  On arrival to the ED she had a normal temperature and heart rate of 69 blood pressure 120/61 and she was saturating well on room air.  She was worked  up and had a CT head that was negative for any acute intracranial abnormality but there was moderate to severe chronic small vessel ischemic changes and CT angio of the head and neck was negative for LVO.  Chest x-ray showed some atelectasis at the bases and EEG was being obtained and neurology been consulted.  EEG done and showed that the study was suggestive of cortical dysfunction arising from the right hemisphere likely secondary to underlying structural abnormality with no seizures or epileptiform discharges seen throughout the recording.  Neurology also recommended further workup with an MRI of the brain without contrast and would not pursue stroke or TIA risk factor workup unless MRI is positive for stroke  Thus recommended a 2D echocardiogram as well as orthostatic vital signs, monitoring on telemetry as well as obtain a chest x-ray and urinalysis.  Neurology recommending also obtain PT and OT to further evaluate and treat and they feel that if the results are unremarkable she will need outpatient neurology follow-up.  Workup was relatively unremarkable neurology cleared the patient for discharge and she will need to follow-up with PCP and neurology outpatient setting  Assessment and Plan:  Seizure-like activity (HCC) with dizziness spells and passing out spells with periods of unresponsiveness and hypersomnia preceded by nausea vomiting, improved and felt to be cognitive fluctuations in the setting of Parkinsonian syndrome -Placed Observation med/tele bed.  -Seen by neurology.  -Undergoing EE and it showed that the study was suggestive of cortical dysfunction arising from the right hemisphere likely secondary to underlying structural abnormality with no seizures or epileptiform discharges seen throughout the recording. -[employer]  neurology workup. See their recommendations with MRI brain, CTA head/neck, 2D ECHO -MRI done and showed "No acute intracranial process. No evidence of acute or subacute  infarct." -CTA Head/Neck done and showed "No intracranial large vessel occlusion or significant stenosis. No hemodynamically significant stenosis in the neck. Aortic Atherosclerosis." -ECHO done and showed an EF of 65 to 70% and showed The left ventricle has no regional wall motion abnormalities. Left ventricular diastolic parameters are consistent with Grade I diastolic  dysfunction (impaired relaxation).  -Check Orthostatic VS done and she was not orthostatic but will send her out with TED hose just in case. -Will need PT/OT/SL to further Evaluate and Treat and they are recommending home health and have arranged -Will Continue to Monitor on Telemetry -Check CXR and showed "There strandy opacities in both lung bases. The cardiomediastinal silhouette is within normal limits. Costophrenic angles are clear. There is no pneumothorax or acute fracture" and U/A Negative -She had LTM done and showed "This study is suggestive of cortical dysfunction arising from right hemisphere likely secondary to underlying structural abnormality. No seizures or epileptiform discharges were seen throughout the recording." -Neurology has cleared the patient for discharge and she will need to follow-up with Dr. Arbutus Leas and LTM was discontinued -Urine unremarkable and no cardiac abnormality seen.  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 Arbutus Leas from presumed parkinson's like disease with Sinemet per neurology recommending continue home doses of levodopa and carbidopa for now -Follows up with Dr. Arbutus Leas in outpatient setting within 1 week   Essential Hypertension -Will hold off on BP meds until after MRI brain to r/o stroke. -Continue monitor blood pressures per protocol -Blood pressure reading was improved at 127/77 however she was little soft but is now stable  Hypokalemia -Patient's K+ Level Trend: Recent Labs  Lab 01/20/23 1456  01/21/23 1025 01/22/23 0232  K 3.6 3.8 3.2*  -Replete with po Kcl 40 mEQ BID x2 -Continue to Monitor and Replete as Necessary -Repeat CMP within 1 week   DNR (do not resuscitate)/DNI(Do Not Intubate) -On 01-21-2023. Verified with pt that she wants to be DNR/DNI. Witnessed by her dtr(Ronni Bidwell). Pt states she has the yellow DNR form at home.   Normocytic Anemia -Hgb/Hct Trend: Recent Labs  Lab 01/20/23 1456 01/21/23 1025 01/22/23 0232  HGB 10.6* 10.2* 10.1*  HCT 33.4* 31.9* 31.2*  MCV 88.8 88.1 87.2  -Anemia panel was checked and showed an iron level of 34, UIBC 352, TIBC 386, saturation ratios of 9%, ferritin level 8, folate of 26.1, vitamin B12 664 -Continue to monitor for signs and symptoms bleeding; no overt bleeding noted and will need to repeat CBC within 1 week  Hypoalbuminemia -Patient's Albumin Trend: Recent Labs  Lab 01/21/23 1025 01/22/23 0232  ALBUMIN 3.4* 3.3*  -Continue to Monitor and Trend and repeat CMP within 1 week   Active Pressure Injury/Wound(s)     Pressure Ulcer  Duration          Pressure Injury 01/21/23 Perineum Stage 1 -  Intact skin with non-blanchable redness of a localized area usually over a bony prominence. non blanching redness 3 days           Consultants: Neurology Procedures performed: As delineated as above  Disposition: Home health Diet recommendation:  Discharge Diet Orders (From admission, onward)     Start     Ordered   01/22/23 0000  Diet - low sodium heart healthy  01/22/23 1339           Cardiac and Carb modified diet DISCHARGE MEDICATION: Allergies as of 01/22/2023       Reactions   Codeine Phosphate [codeine] Nausea And Vomiting   Latex Hives   Sulfur Rash   Lyrica Cr [pregabalin Er]    Unknown reaction   Oxycodone Rash   Rash on roof of mouth        Medication List     STOP taking these medications    ibuprofen 100 MG/5ML suspension Commonly known as: ADVIL   tiZANidine 2 MG  tablet Commonly known as: ZANAFLEX       TAKE these medications    acetaminophen 325 MG tablet Commonly known as: TYLENOL Take 2 tablets (650 mg total) by mouth every 6 (six) hours as needed for mild pain (or Fever >/= 101).   AMBULATORY NON FORMULARY MEDICATION Walker:  2 wheels, 2 tennis balls Dx: G20   aspirin EC 81 MG tablet Take 81 mg by mouth daily.   carbidopa-levodopa 50-200 MG tablet Commonly known as: SINEMET CR TAKE 1 TABLET BY MOUTH AT BEDTIME   carbidopa-levodopa 25-100 MG tablet Commonly known as: SINEMET IR TAKE 1 & 1/2 TABLETS BY MOUTH AT 7AM, 11AM & 4PM   Emergen-C Vitamin C Pack Take 1 packet by mouth daily.   losartan 25 MG tablet Commonly known as: COZAAR Take 1 tablet (25 mg total) by mouth daily. What changed:  medication strength how much to take   ondansetron 4 MG tablet Commonly known as: ZOFRAN Take 1 tablet (4 mg total) by mouth every 6 (six) hours as needed for nausea.   Os-Cal Extra D3 500-600 MG-UNIT Tabs Generic drug: Calcium Carb-Cholecalciferol Take by mouth.   pravastatin 20 MG tablet Commonly known as: PRAVACHOL Take 20 mg by mouth daily. Take 1 tablet by mouth every day for cholesterol               Discharge Care Instructions  (From admission, onward)           Start     Ordered   01/22/23 0000  Discharge wound care:       Comments: Frequent Turning and Avoid pressure   01/22/23 1339            Follow-up Information     [facility].   Specialty: Home Health Services Why: The home health agency will contact you for the first home visit. Contact information: [address] [place] [address] [phone]                Discharge Exam: Filed Weights   01/20/23 1441 01/22/23 0448  Weight: 58.1 kg 56.4 kg   Vitals:   01/22/23 1305 01/22/23 1307  BP: 125/78 120/75  Pulse:    Resp:    Temp:    SpO2: 100% 99%   Examination: Physical  Exam:  Constitutional: WN/WD Caucasian female in no acute distress appears calm Respiratory: Diminished to auscultation bilaterally, no wheezing, rales, rhonchi or crackles. Normal respiratory effort and patient is not tachypenic. No accessory muscle use.  Unlabored breathing Cardiovascular: RRR, no murmurs / rubs / gallops. S1 and S2 auscultated. No extremity edema. Abdomen: Soft, non-tender, non-distended.  Bowel sounds positive.  GU: Deferred. Musculoskeletal: No clubbing / cyanosis of digits/nails. No joint deformity upper and lower extremities.  Skin: No rashes, lesions, ulcers on limited skin evaluation. No induration; Warm and dry.  Neurologic: CN 2-12  grossly intact with no focal deficits. Romberg sign and cerebellar reflexes not assessed.  Psychiatric: Normal judgment and insight. Alert and oriented x 3. Normal mood and appropriate affect.   Condition at discharge: stable  The results of significant diagnostics from this hospitalization (including imaging, microbiology, ancillary and laboratory) are listed below for reference.   Imaging Studies: ECHOCARDIOGRAM COMPLETE  Result Date: 01/21/2023    ECHOCARDIOGRAM REPORT   Patient Name:   MACLAINE SUTHARD Date of Exam: 01/21/2023 Medical Rec #:  [id number]      Height:       62.0 in Accession #:    [id number]     Weight:       128.0 lb Date of Birth:  [birth year]      BSA:          1.581 m Patient Age:    72 years       BP:           127/77 mmHg Patient Gender: F              HR:           81 bpm. Exam Location:  Inpatient Procedure: 2D Echo, Cardiac Doppler and Color Doppler Indications:    R55 Syncope  History:        Patient has no prior history of Echocardiogram examinations.                 Risk Factors:Hypertension. Siezures.  Sonographer:    Sheralyn Boatman RDCS Referring Phys: [id number] ERIC CHEN  Sonographer Comments: No subcostal window. IMPRESSIONS  1. Left ventricular ejection fraction, by estimation, is 65 to 70%. The left ventricle has normal  function. The left ventricle has no regional wall motion abnormalities. Left ventricular diastolic parameters are consistent with Grade I diastolic dysfunction (impaired relaxation).  2. Right ventricular systolic function is normal. The right ventricular size is normal.  3. The mitral valve is degenerative. Mild mitral valve regurgitation. No evidence of mitral stenosis.  4. The aortic valve is tricuspid. There is mild calcification of the aortic valve. There is mild thickening of the aortic valve. Aortic valve regurgitation is mild. No aortic stenosis is present. Comparison(s): No prior Echocardiogram. FINDINGS  Left Ventricle: Left ventricular ejection fraction, by estimation, is 65 to 70%. The left ventricle has normal function. The left ventricle has no regional wall motion abnormalities. The left ventricular internal cavity size was normal in size. There is  no left ventricular hypertrophy. Left ventricular diastolic parameters are consistent with Grade I diastolic dysfunction (impaired relaxation). Right Ventricle: The right ventricular size is normal. No increase in right ventricular wall thickness. Right ventricular systolic function is normal. Left Atrium: Left atrial size was normal in size. Right Atrium: Right atrial size was normal in size. Pericardium: There is no evidence of pericardial effusion. Presence of epicardial fat layer. Mitral Valve: The mitral valve is degenerative in appearance. Mild mitral annular calcification. Mild mitral valve regurgitation. No evidence of mitral valve stenosis. Tricuspid Valve: The tricuspid valve is grossly normal. Tricuspid valve regurgitation is mild . No evidence of tricuspid stenosis. Aortic Valve: The aortic valve is tricuspid. There is mild calcification of the aortic valve. There is mild thickening of the aortic valve. Aortic valve regurgitation is mild. Aortic regurgitation PHT measures 522 msec. No aortic stenosis is present. Pulmonic Valve: The pulmonic  valve was grossly normal. Pulmonic valve regurgitation is mild. No evidence of pulmonic stenosis. Aorta: The aortic root and ascending aorta are structurally normal,  with no evidence of dilitation. Venous: The inferior vena cava was not well visualized. IAS/Shunts: The atrial septum is grossly normal.  LEFT VENTRICLE PLAX 2D LVIDd:         4.50 cm     Diastology LVIDs:         2.80 cm     LV e' medial:    5.06 cm/s LV PW:         0.90 cm     LV E/e' medial:  12.4 LV IVS:        1.00 cm     LV e' lateral:   9.46 cm/s LVOT diam:     2.20 cm     LV E/e' lateral: 6.6 LV SV:         71 LV SV Index:   45 LVOT Area:     3.80 cm  LV Volumes (MOD) LV vol d, MOD A2C: 67.2 ml LV vol d, MOD A4C: 62.0 ml LV vol s, MOD A2C: 20.7 ml LV vol s, MOD A4C: 18.6 ml LV SV MOD A2C:     46.5 ml LV SV MOD A4C:     62.0 ml LV SV MOD BP:      45.9 ml RIGHT VENTRICLE            IVC RV S prime:     8.49 cm/s  IVC diam: 1.90 cm RVOT diam:      3.00 cm TAPSE (M-mode): 2.3 cm LEFT ATRIUM             Index        RIGHT ATRIUM          Index LA diam:        3.40 cm 2.15 cm/m   RA Area:     8.73 cm LA Vol (A2C):   21.5 ml 13.60 ml/m  RA Volume:   15.50 ml 9.80 ml/m LA Vol (A4C):   40.9 ml 25.86 ml/m LA Biplane Vol: 30.9 ml 19.54 ml/m  AORTIC VALVE             PULMONIC VALVE LVOT Vmax:   99.10 cm/s  PR End Diast Vel: 1.78 msec LVOT Vmean:  63.500 cm/s LVOT VTI:    0.187 m AI PHT:      522 msec  AORTA Ao Root diam: 3.50 cm Ao Asc diam:  3.60 cm MITRAL VALVE               TRICUSPID VALVE MV Area (PHT): 3.37 cm    TR Peak grad:   17.1 mmHg MV Decel Time: 225 msec    TR Vmax:        207.00 cm/s MV E velocity: 62.60 cm/s MV A velocity: 96.00 cm/s  SHUNTS MV E/A ratio:  0.65        Systemic VTI:  0.19 m                            Systemic Diam: 2.20 cm                            Pulmonic Diam: 3.00 cm Lennie Odor MD Electronically signed by Lennie Odor MD Signature Date/Time: 01/21/2023/5:35:49 PM    Final    Overnight EEG with video  Result  Date: 01/21/2023 Charlsie Quest, MD     01/22/2023  8:42 AM Patient Name: Delaynie Cossio MRN: [id number] Epilepsy Attending:  Charlsie Quest Referring Physician/Provider: Milon Dikes, MD Duration: 01/20/2023 2320 to 01/21/2023 2320 Patient history: 72 yo F with multiple episodes of passing out with preceding dizzy spells then nausea and vomiting and then sleeping for a while without any recollection of the events in a patient with 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 EEG to evaluate for seizure Level of alertness: Awake, asleep AEDs during EEG study: None Technical aspects: This EEG study was done with scalp electrodes positioned according to the 10-20 International system of electrode placement. Electrical activity was reviewed with band pass filter of 1-70Hz , sensitivity of 7 uV/mm, display speed of 33mm/sec with a 60Hz  notched filter applied as appropriate. EEG data were recorded continuously and digitally stored.  Video monitoring was available and reviewed as appropriate. Description: The posterior dominant rhythm consists of 13 Hz activity of moderate voltage (25-35 uV) seen predominantly in posterior head regions, symmetric and reactive to eye opening and eye closing. Sleep was characterized by vertex waves, sleep spindles (12 to 14 Hz), maximal frontocentral region. EEG showed continuous 3 to 6 Hz theta-delta slowing in right hemisphere. Hyperventilation and photic stimulation were not performed.   ABNORMALITY - Continuous slow, right hemisphere IMPRESSION: This study is suggestive of cortical dysfunction arising from right hemisphere likely secondary to underlying structural abnormality. No seizures or epileptiform discharges were seen throughout the recording. Charlsie Quest   MR BRAIN WO CONTRAST  Result Date: 01/21/2023 CLINICAL DATA:  Syncopal episodes, nausea, dizziness, weakness EXAM: MRI HEAD WITHOUT CONTRAST TECHNIQUE: Multiplanar, multiecho pulse sequences of the brain and  surrounding structures were obtained without intravenous contrast. COMPARISON:  11/10/2020 MRI head, correlation is also made with 01/20/2023 CT head FINDINGS: Brain: No restricted diffusion to suggest acute or subacute infarct. No acute hemorrhage, mass, mass effect, or midline shift. No hydrocephalus or extra-axial collection. Normal craniocervical junction. Punctate focus of hemosiderin deposition in the posterior right temporal lobe, likely sequela of hypertensive microhemorrhage. Scattered and confluent T2 hyperintense signal in the periventricular white matter, likely the sequela of moderate chronic small vessel ischemic disease. Mildly advanced cerebral atrophy for age. Vascular: Normal arterial flow voids. Skull and upper cervical spine: Normal marrow signal. Sinuses/Orbits: Clear paranasal sinuses. No acute finding in the orbits. Other: The mastoid air cells are well aerated. IMPRESSION: No acute intracranial process. No evidence of acute or subacute infarct. Electronically Signed   By: Wiliam Ke M.D.   On: 01/21/2023 02:14   CT ANGIO HEAD NECK W WO CM  Result Date: 01/20/2023 CLINICAL DATA:  Syncope, nausea, dizziness, weakness EXAM: CT ANGIOGRAPHY HEAD AND NECK WITH AND WITHOUT CONTRAST TECHNIQUE: Multidetector CT imaging of the head and neck was performed using the standard protocol during bolus administration of intravenous contrast. Multiplanar CT image reconstructions and MIPs were obtained to evaluate the vascular anatomy. Carotid stenosis measurements (when applicable) are obtained utilizing NASCET criteria, using the distal internal carotid diameter as the denominator. RADIATION DOSE REDUCTION: This exam was performed according to the departmental dose-optimization program which includes automated exposure control, adjustment of the mA and/or kV according to patient size and/or use of iterative reconstruction technique. CONTRAST:  75mL OMNIPAQUE IOHEXOL 350 MG/ML SOLN COMPARISON:  No prior  CTA available, correlation is made with 01/20/2023 CT head FINDINGS: CT HEAD FINDINGS For noncontrast findings, please see same day CT head. CTA NECK FINDINGS Aortic arch: Two-vessel arch with a common origin of the brachiocephalic and left common carotid arteries. Imaged portion shows no evidence of aneurysm or dissection. No significant stenosis of  the major arch vessel origins. Aortic atherosclerosis. Right carotid system: No evidence of dissection, occlusion, or hemodynamically significant stenosis (greater than 50%). Left carotid system: No evidence of dissection, occlusion, or hemodynamically significant stenosis (greater than 50%). Vertebral arteries: No evidence of dissection, occlusion, or hemodynamically significant stenosis (greater than 50%). Skeleton: No acute osseous abnormality. Degenerative changes in the cervical spine. Other neck: A small hypoenhancing foci in the thyroid, the largest of which measures up to 3 mm, Fortune 0 follow-up is indicated. (Reference: J Am Coll Radiol. 2015 Feb;12(2): 143-50) Upper chest: No focal pulmonary opacity or pleural effusion. Emphysema. Review of the MIP images confirms the above findings CTA HEAD FINDINGS Anterior circulation: Both internal carotid arteries are patent to the termini, without significant stenosis. A1 segments patent. Normal anterior communicating artery. Anterior cerebral arteries are patent to their distal aspects without significant stenosis. No M1 stenosis or occlusion. MCA branches perfused to their distal aspects without significant stenosis. Posterior circulation: Vertebral arteries patent to the vertebrobasilar junction without significant stenosis. Posterior inferior cerebellar arteries patent proximally. Basilar patent to its distal aspect without significant stenosis. Superior cerebellar arteries patent proximally. Patent P1 segments. Near fetal origin of the left PCA from the left posterior communicating artery. PCAs perfused to their  distal aspects without significant stenosis. The bilateral posterior communicating arteries are patent. Venous sinuses: Patent. Anatomic variants: Near fetal origin of the left PCA. Review of the MIP images confirms the above findings IMPRESSION: 1. No intracranial large vessel occlusion or significant stenosis. 2. No hemodynamically significant stenosis in the neck. 3. Aortic atherosclerosis. Aortic Atherosclerosis (ICD10-I70.0). Electronically Signed   By: Wiliam Ke M.D.   On: 01/20/2023 23:54   DG Chest 1 View  Result Date: 01/20/2023 CLINICAL DATA:  Cough EXAM: CHEST  1 VIEW COMPARISON:  None Available. FINDINGS: There strandy opacities in both lung bases. The cardiomediastinal silhouette is within normal limits. Costophrenic angles are clear. There is no pneumothorax or acute fracture. IMPRESSION: Strandy opacities in both lung bases, atelectasis versus infection. Electronically Signed   By: Darliss Cheney M.D.   On: 01/20/2023 23:13   CT HEAD WO CONTRAST ( )  Result Date: 01/20/2023 CLINICAL DATA:  Headache, new onset (Age >= 51y) weakness EXAM: CT HEAD WITHOUT CONTRAST TECHNIQUE: Contiguous axial images were obtained from the base of the skull through the vertex without intravenous contrast. RADIATION DOSE REDUCTION: This exam was performed according to the departmental dose-optimization program which includes automated exposure control, adjustment of the mA and/or kV according to patient size and/or use of iterative reconstruction technique. COMPARISON:  None Available. FINDINGS: Brain: No evidence of acute infarction, hemorrhage, hydrocephalus, extra-axial collection or mass lesion/mass effect. Sequela of moderate to severe chronic microvascular ischemic change. Vascular: No hyperdense vessel or unexpected calcification. Skull: Normal. Negative for fracture or focal lesion. Sinuses/Orbits: No middle ear or mastoid effusion. Paranasal sinuses are clear. Orbits are unremarkable. Other: None.  IMPRESSION: No acute intracranial abnormality. Sequela of moderate to severe chronic microvascular ischemic change. Electronically Signed   By: Lorenza Cambridge M.D.   On: 01/20/2023 16:46    Microbiology: No results found for this or any previous visit.  Labs: CBC: Recent Labs  Lab 01/20/23 1456 01/21/23 1025 01/22/23 0232  WBC 7.2 7.2 5.5  NEUTROABS  --  4.7 3.0  HGB 10.6* 10.2* 10.1*  HCT 33.4* 31.9* 31.2*  MCV 88.8 88.1 87.2  PLT 307 269 301   Basic Metabolic Panel: Recent Labs  Lab 01/20/23 1456 01/21/23 1025 01/22/23 0232  NA 136 139 137  K 3.6 3.8 3.2*  CL 97* 100 104  CO2 26 26 25   GLUCOSE 117* 99 92  BUN 11 9 8   CREATININE 0.58 0.69 0.55  CALCIUM 9.5 9.7 9.0  MG  --  1.8 2.1  PHOS  --  3.3 2.9   Liver Function Tests: Recent Labs  Lab 01/21/23 1025 01/22/23 0232  AST 20 18  ALT <5 5  ALKPHOS 62 64  BILITOT 0.7 0.6  PROT 6.3* 5.9*  ALBUMIN 3.4* 3.3*   CBG: Recent Labs  Lab 01/20/23 1446  GLUCAP 116*   Discharge time spent: greater than 30 minutes.  Signed: Marguerita Merles, DO [facility] 01/24/2023

## 2023-01-22 NOTE — Procedures (Addendum)
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 EEG to evaluate for seizure   Level of alertness: Awake, asleep   AEDs during EEG study: None   Technical aspects: This EEG study was done with scalp electrodes positioned according to the 10-20 International system of electrode placement. Electrical activity was reviewed with band pass filter of 1-70Hz , sensitivity of 7 uV/mm, display speed of 44mm/sec with a 60Hz  notched filter applied as appropriate. EEG data were recorded continuously and digitally stored.  Video monitoring was available and reviewed as appropriate.   Description: The posterior dominant rhythm consists of 13 Hz activity of moderate voltage (25-35 uV) seen predominantly in posterior head regions, symmetric and reactive to eye opening and eye closing. Sleep was characterized by vertex waves, sleep spindles (12 to 14 Hz), maximal frontocentral region. EEG showed continuous 3 to 6 Hz theta-delta slowing in right hemisphere. Hyperventilation and photic stimulation were not performed.      ABNORMALITY - Continuous slow, right hemisphere   IMPRESSION: This study is suggestive of cortical dysfunction arising from right hemisphere likely secondary to underlying structural abnormality. No seizures or epileptiform discharges were seen throughout the recording.   Kosta Schnitzler [name]

## 2023-01-22 NOTE — Evaluation (Signed)
Clinical/Bedside Swallow Evaluation Patient Details  Name: Karen Nguyen MRN: [id number] Date of Birth: [birth year]  Today's Date: 01/22/2023 Time: SLP Start Time (ACUTE ONLY): 3474 SLP Stop Time (ACUTE ONLY): 0953 SLP Time Calculation (min) (ACUTE ONLY): 11 min  Past Medical History:  Past Medical History:  Diagnosis Date   ACP (advance care planning)    Allergic rhinitis    Balance disorder    Constipation    Coordination of complex care    Dizziness    Encounter for preventive care    Essential tremor    Fall as cause of accidental injury at home as place of occurrence    Fibromyalgia    Hearing difficulty    Herpes zoster    History of cigarette smoking    Hot flashes    HTN (hypertension)    Hyperlipidemia    Inguinal hernia    Left ankle injury    Left shoulder pain    Long term use of drug    Memory changes    Occipital neuritis    Osteoarthritis of both hands    Osteoporosis    Raynaud's disease    Scoliosis    Status post inguinal hernia repair    Subcutaneous mass    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 EEG suggestive of cortical dysfunction arising from right hemisphere likely secondary to underlying structural abnormality. No seizures or epileptiform discharges were seen throughout the recording. PMH significant for Parkinson's disease, HTN, constipation, dizziness, fibromyalgia, history of tobacco abuse, hyperlipidemia, osteoporosis, Raynaud's.    Assessment / Plan / Recommendation  Clinical Impression  Patient presents with what appears to be normal  oropharyngeal swallowing function based on bedside swallow evaluation. Oral transit of regular texture boluses timely and complete. No overt s/s of aspiration observed with any consistency, even when challenged with large bites and multiple consecutive straw sips of thin liquid via the 3 ounce water test. Patient and family report no concerns other than spouse reporting that vocal quality has decreased in intensity overt time related to Parkinson's disease. Patient sounds very intelligible with adequate volume during this visit. Do suggest however that patient f/u with OP SLP services with focus on voice therapy if desired. Spouse verbalized understanding. No acute SLP f/u indicated. SLP Visit Diagnosis: Dysphagia, unspecified (R13.10)       Diet Recommendation Regular;Thin liquid    Liquid Administration via: Straw;Cup Medication Administration: Whole meds with liquid Supervision: Patient able to self feed Compensations: Slow rate;Small sips/bites Postural Changes: Seated upright at 90 degrees    Other  Recommendations Oral Care Recommendations: Oral care BID    Recommendations for follow up therapy are one component of a multi-disciplinary discharge planning process, led by the attending physician.  Recommendations may be updated based on patient status, additional functional criteria and insurance authorization.  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 EEG suggestive of cortical dysfunction arising from right hemisphere likely secondary to underlying structural abnormality. No seizures or epileptiform discharges were seen throughout the recording. PMH significant for Parkinson's disease,  HTN, constipation, dizziness, fibromyalgia, history of tobacco abuse, hyperlipidemia, osteoporosis, Raynaud's. Type of Study: Bedside Swallow Evaluation Previous Swallow Assessment: none Diet Prior to this Study: Regular;Thin liquids (Level 0) Temperature Spikes Noted: No Respiratory Status: Room air History of Recent Intubation: No Behavior/Cognition: Alert;Cooperative;Pleasant mood Oral Cavity Assessment: Within Functional Limits Oral Care Completed by SLP: No Oral Cavity - Dentition: Dentures, top;Dentures, bottom Vision: Functional for self-feeding Self-Feeding Abilities: Able to feed self Patient Positioning: Upright in chair Baseline Vocal Quality: Normal Volitional Cough: Strong Volitional Swallow: Able to elicit    Oral/Motor/Sensory Function Overall Oral Motor/Sensory Function: Mild impairment Facial ROM: Within Functional Limits Facial Symmetry: Within Functional Limits Facial Strength: Within Functional Limits Facial Sensation: Within Functional Limits Lingual ROM: Within Functional Limits Lingual Symmetry: Within Functional Limits Lingual Strength: Reduced Lingual Sensation: Within Functional Limits Velum: Within Functional Limits Mandible: Within Functional Limits   Ice Chips Ice chips: Not tested   Thin Liquid Thin Liquid: Within functional limits Presentation: Self Fed;Straw    Nectar Thick Nectar Thick Liquid: Not tested   Honey Thick Honey Thick Liquid: Not tested   Puree Puree: Not tested   Solid     Solid: Within functional limits Presentation: Self Fed     [employer] MA, CCC-SLP  Odarius Dines Meryl 01/22/2023,10:01 AM

## 2023-01-22 NOTE — Progress Notes (Signed)
Maint attempted, pt asleep. Maint deferred until morning

## 2023-01-22 NOTE — Care Management Obs Status (Signed)
MEDICARE OBSERVATION STATUS NOTIFICATION   Patient Details  Name: Karen Nguyen MRN: [id number] Date of Birth: 1950/07/15   Medicare Observation Status Notification Given:  Yes    Kermit Balo, RN 01/22/2023, 10:19 AM

## 2023-01-22 NOTE — Progress Notes (Signed)
Occupational Therapy Treatment Patient Details Name: Karen Nguyen MRN: [id number] DOB: January 06, 1951 Today's Date: 01/22/2023   History of present illness Pt is a 72 y.o. female presenting 01/20/2023 with 4 syncopal episodes over the last two to three weeks in which she feels nauseous and passes out. MRI brain negative for acute findings. CXR with some atelectasis in the bases. EEG suggestive of cortical dysfunction arising from R hemisphere likely secondary to underlying structural abnormality; no seizures noted. Neurology suspecting cortical basal ganglionic degeneration. PMH significant for Parkinson's disease, HTN, constipation, dizziness, fibromyalgia, history of tobacco abuse, hyperlipidemia, osteoporosis, Raynaud's.   OT comments  Pt with good progress toward established OT goals. Less external distractibility today and more able to focus on OT session/quality of movement throughout. Pt educated on PWR! moves to practice at home from Certified therapist and locations that offer OP PD specific training for later possibilities after cessation of HH. Pt and husband educated regarding 4 basic PWR moves and full body engagement (eye and voice boosts). Pt husband encouraged to cue pt mobility to optimize posture, weight shifting, truncal rotation, transitions as well as attention and visual engagement. Focus session on 3 basic PWR moves (Step, UP, Twist), and pt with good tolerance with cues for optimal body mechanics. Pt to benefit from [facility] at discharge.       If plan is discharge home, recommend the following:  A little help with walking and/or transfers;A little help with bathing/dressing/bathroom;Assistance with cooking/housework;Assist for transportation;Help with stairs or ramp for entrance;Direct supervision/assist for medications management;Direct supervision/assist for financial management   Equipment Recommendations  None recommended by OT    Recommendations for Other Services       Precautions / Restrictions Precautions Precautions: Fall Restrictions Weight Bearing Restrictions: No       Mobility Bed Mobility               General bed mobility comments: Chair on arrival and departure    Transfers Overall transfer level: Needs assistance Equipment used: Rolling walker (2 wheels) Transfers: Sit to/from Stand Sit to Stand: Min assist           General transfer comment: min A due to retropulsion with initial rise     Balance Overall balance assessment: Needs assistance Sitting-balance support: Single extremity supported, No upper extremity supported, Feet supported Sitting balance-Leahy Scale: Fair     Standing balance support: Bilateral upper extremity supported, Single extremity supported, During functional activity Standing balance-Leahy Scale: Poor Standing balance comment: reliant on external support or AD                           ADL either performed or assessed with clinical judgement   ADL Overall ADL's : Needs assistance/impaired     Grooming: Wash/dry hands;Contact guard assist;Standing Grooming Details (indicate cue type and reason): GCA for safety; max compensatory technique with pt needing cues for involvement of L hand rather than to use only R hand                             Functional mobility during ADLs: Moderate assistance;Contact guard assist;Rolling walker (2 wheels) General ADL Comments: one LOB requiring mod A to recover. Educated on core concepts of PWR moves from this PWR! certified therapist. including focusing on posture, weight shifting, twisting, truncal rotation, and steps. See exercises below    Extremity/Trunk Assessment Upper Extremity Assessment Upper Extremity Assessment: Generalized weakness;Left  hand dominant RUE Deficits / Details: intention tremor noted. Decr coordination bilaterally L>R LUE Deficits / Details: L>R intention tremor and weakness. Decr coordination bilaterally  L>R. Poor functional grasp to retrieve and move items, etc   Lower Extremity Assessment Lower Extremity Assessment: Defer to PT evaluation        Vision       Perception     Praxis      Cognition Arousal: Alert Behavior During Therapy: [facility] for tasks assessed/performed Overall Cognitive Status: Impaired/Different from baseline Area of Impairment: Attention, Following commands, Safety/judgement, Awareness, Problem solving                   Current Attention Level: Sustained (dififculty with selective attention, sighly distractible. Difficulty with set switching)   Following Commands: Follows one step commands consistently, Follows one step commands with increased time Safety/Judgement: Decreased awareness of safety Awareness: Emergent Problem Solving: Slow processing, Requires verbal cues General Comments: Mod cues for multisystem attention/involvement in tasks and set switching. Neeidng cues for larger movement patterns and posture with prolonged mobility, but significant improvement overall from yesterday in that pt is less distracted by stationary environemntal items, heart monitor, etc        Exercises Exercises: Other exercises Other Exercises Other Exercises: introduced PWR moves as welll as educating on OP neuro [place], [place], and [place] locations for future consideration after cessation of HH. Pt and husband both with decr memory of session education yesterday. providing handouts in regard to building blocks of function, 4 basic PWR moves, and full body involvement. Implementing with PWR up from chair and then 5 consecutive PWR ups in standing prior to initiation of rest of session. Tranisitoned to PWR step to optimize transition and pt with good step with large amplitude but needing cues for placement of LLE out front rather than nearly crossing over R pt able to activate 10x. Transitioned into horizontal flow challenging functional mobility with walking  with RW to sink and then into hall. mild decr speed with turns, but pt continues with large steps on command. Other Exercises: Standing at sink, introducing PWR twist to challenge BUE coordination, strength, and axial mobility/trunk rotation. pt with good performance and even initiating a power rock (weight shift) to transition grooming items from L to R with RUE. From L to R, activity downgraded with items closer to pt at inception from R to L and OT handing item to pt to achieve good grasp. 7/7 trials requiring a second attempt to release lotion bottle etc upright rather than rolling onto its side. Good truncal rotation with min cues and engagement with eye boosts following hand to item to be retrieved    Shoulder Instructions       General Comments VSS    Pertinent Vitals/ Pain       Pain Assessment Pain Assessment: No/denies pain  Home Living                                          Prior Functioning/Environment              Frequency  Min 2X/week        Progress Toward Goals  OT Goals(current goals can now be found in the care plan section)  Progress towards OT goals: Progressing toward goals  Acute Rehab OT Goals Patient Stated Goal: get home OT Goal Formulation: With patient Time  For Goal Achievement: 02/04/23 Potential to Achieve Goals: Good ADL Goals Pt Will Perform Grooming: with supervision;standing Pt Will Perform Lower Body Dressing: with supervision;sit to/from stand Pt Will Transfer to Toilet: with supervision;ambulating;regular height toilet  Plan      Co-evaluation                 AM-PAC OT "6 Clicks" Daily Activity     Outcome Measure   Help from another person eating meals?: None Help from another person taking care of personal grooming?: A Little Help from another person toileting, which includes using toliet, bedpan, or urinal?: A Lot Help from another person bathing (including washing, rinsing, drying)?: A Lot Help  from another person to put on and taking off regular upper body clothing?: A Little Help from another person to put on and taking off regular lower body clothing?: A Lot 6 Click Score: 16    End of Session Equipment Utilized During Treatment: Gait belt;Rolling walker (2 wheels)  OT Visit Diagnosis: Unsteadiness on feet (R26.81);Muscle weakness (generalized) (M62.81);Other symptoms and signs involving cognitive function;History of falling (Z91.81);Repeated falls (R29.6)   Activity Tolerance Patient tolerated treatment well   Patient Left in chair;with call bell/phone within reach;with family/visitor present   Nurse Communication Mobility status        Time: 1345-1425 OT Time Calculation (min): 40 min  Charges: OT General Charges $OT Visit: 1 Visit OT Treatments $Self Care/Home Management : 8-22 mins $Therapeutic Exercise: 23-37 mins  Tyler Deis, OTR/L [facility] Acute Rehabilitation Office: [phone]   Myrla Halsted 01/22/2023, 2:45 PM

## 2023-01-22 NOTE — Progress Notes (Signed)
LTM EEG discontinued - no skin breakdown at unhook. [facility] notified 

## 2023-01-22 NOTE — Progress Notes (Addendum)
Neurology Progress Note  Brief HPI:   Karen Nguyen is a 72 y.o. 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  They report that she has episodes every now and then where she feels that she is getting dizzy, she sits, feels nauseous and has had episodes of vomiting at times and then loses consciousness sometimes not coming to for multiple hours and going into a deep sleep during which she is unresponsive.  She has no recollection of these events.  She only remembers feeling dizzy and the next and she knows that she woke up in the family telling her that she had passed out.  She has not hit her head with these episodes.  She has not had any shaking concerning for seizures clinically. Follows with movement disorder clinic-Dr. Tat.  Prior MRI with mild to moderate small vessel disease and chronic right frontal infarct.  DaTscan positive with decreased radiotracer activity in the right striate.  Genetic testing done outpatient was negative for HD.  Participates in "rock steady boxing".  On carbidopa-levodopa.  Subjective: Patient seen in room with husband at the bedside. She additionally reports long term issues with urinary incontinence and constipation that she has seen her PCP for.  EEG disconnected today. Orthostatic vitals pending.  Orthostatic VS for the past 24 hrs:  BP- Lying Pulse- Lying BP- Sitting Pulse- Sitting BP- Standing at 0 minutes Pulse- Standing at 0 minutes  01/22/23 1303 120/80 78 125/78 93 120/75 98      Exam: Vitals:   01/22/23 0036 01/22/23 0447  BP: 113/86 125/68  Pulse: 82 67  Resp: 18 16  Temp: 97.9 F (36.6 C) 97.7 F (36.5 C)  SpO2: 97% 97%   General: Awake alert in no  distress HEENT: Normocephalic, atraumatic, moist oral mucous membranes. CVS: Regular rhythm Lungs: Clear Extremities warm and well-perfused  Neurological exam Awake alert oriented x 3.  No evidence of aphasia.  No evidence of dysarthria. Cranial nerves: Pupils equal round reactive to light, extraocular movements intact although there is a broken quality to her visual pursuits, visual fields full, face symmetric, facial sensation intact, diminished auditory acuity bilaterally, tongue and palate midline. Motor examination reveals antigravity nearly 5/5 strength symmetric in both upper and lower extremities.  Tone is essentially normal but at times does seem to have paratonia on testing.  She does have tremulousness versus choreiform movements on the left which have been previously documented. Sensation intact to light touch Coordination: Diminished rapid alternating movements bilaterally worse on the left than the right. Gait is cautious, she feels unsteady standing without her slippers and walker. Mild truncal ataxia initially when sitting.     Pertinent Labs: K- 3.2 TSH- 0.734  Imaging Reviewed: 01/20/2023 2320 to 01/21/2023 0830- LTM EEG: This study is suggestive of cortical dysfunction arising from right hemisphere likely secondary to underlying structural abnormality. No seizures or epileptiform discharges were seen throughout the recording.   EEG 01/21/2023 2320 to 01/22/2023 0852  Continuous slow, right hemisphere. This study is suggestive of cortical dysfunction arising from right hemisphere likely secondary to underlying structural abnormality. No seizures or epileptiform discharges were seen throughout the recording.  CT-head: No acute changes.  Chronic microvascular disease.  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 - Her exam is at baseline right now. She has no new findings compared to the prior notes from outpatient neurology. - MRI brain: No acute intracranial process - EEGs: Continuous slow, right hemisphere. - Broad differential for these spells, but in a patient with a Parkinsonian syndrome, cognitive fluctuations are felt to be most likely. Cognitive fluctuations most often occur in Lewy body dementia, a synucleinopathy with essentially the same pathology at the cellular level as Parkinson's disease, with a different distribution of affected regions of the brain. Her symptoms are atypical for seizures and EEGs have shown no seizures or epileptiform discharges during this admission. Episodes of N/V as well as hypotension may be related to autonomic instability, which also occurs in the synucleinopathies. . - No cardiac abnormality while inpatient.  - UA clean.    Recommendations: - No further inpatient Neurology recommendations. Neurohospitalist service will sign off.  - LTM EEG discontinued - Outpatient follow-up with Dr. Arbutus Leas   Patient seen and examined by NP/APP.  Elmer Picker, DNP, FNP-BC [facility] Pager: [phone]  Electronically signed: Dr. Caryl Pina

## 2023-01-22 NOTE — TOC Transition Note (Signed)
Transition of Care [facility]) - CM/SW Discharge Note   Patient Details  Name: Karen Nguyen MRN: [id number] Date of Birth: 09-27-1950  [facility] CM/SW Contact:  Kermit Balo, RN Phone Number: 01/22/2023, 10:22 AM   Clinical Narrative:     The patient lives at home with her spouse. He and caregiver assist with ADL's.  DME at home: walker/ wheelchair/ shower seat Spouse manages her medications ad they deny any issues.  Spouse or caregiver provides needed transportation. Home health arranged with [facility] per pt/spouse choice. Information on the AVS. [facility] will contact them for the first home visit. Spouse will transport home when discharged.   Final next level of care: Home w Home Health Services Barriers to Discharge: No Barriers Identified   Patient Goals and CMS Choice CMS Medicare.gov Compare Post Acute Care list provided to:: Patient Choice offered to / list presented to : Patient, Spouse  Discharge Placement                         Discharge Plan and Services Additional resources added to the After Visit Summary for                            [facility] Arranged: PT, OT, Speech Therapy HH Agency: [facility] Date [facility] Agency Contacted: 01/22/23   Representative spoke with at [facility] Agency: Haywood Lasso  Social Determinants of Health (SDOH) Interventions SDOH Screenings   Food Insecurity: No Food Insecurity (01/21/2023)  Housing: Low Risk  (01/21/2023)  Transportation Needs: No Transportation Needs (01/21/2023)  Utilities: Not At Risk (01/21/2023)  Social Connections: Unknown (11/03/2022)   Received from [facility]  Tobacco Use: Medium Risk (01/20/2023)     Readmission Risk Interventions     No data to display

## 2023-01-23 ENCOUNTER — Telehealth: Payer: Self-pay | Admitting: Neurology

## 2023-01-23 NOTE — Telephone Encounter (Signed)
Called patients husband and he was getting patient in the house and asked me to call back

## 2023-01-23 NOTE — Telephone Encounter (Signed)
Pts spouse is calling in stating that the pt needs to be seen within the next 2 weeks from being discharged from the hospital on 01/22/2023 due to the pt having being DX per the spouse with Parkinson's.  Spouse would like to have a call back.

## 2023-01-23 NOTE — Telephone Encounter (Signed)
Called patients husband and he said patient was vomiting and she was sleeping and would black out and not remember anything

## 2023-02-12 ENCOUNTER — Ambulatory Visit: Payer: PRIVATE HEALTH INSURANCE | Admitting: Neurology

## 2023-02-12 NOTE — Progress Notes (Deleted)
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   -DaTscan positive, with decreased radiotracer activity in the right stratum (consistent with symptoms).   -Genetic testing for HD negative.  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  -Continue carbidopa/levodopa 25/100, 2 tablet 3 times per day -Continue carbidopa/levodopa 50/200 CR at bed.  Having trouble getting paid via [employer] so information given on [facility] -skin biopsy declined -[id number] testing declined   Insomnia, due to nocturia  -discussed urology and believes pcp has a f/u scheduled  3.  Syncopal episodes  -Episodes are preceded by nausea/vomiting and have a component of significant sleepiness following the episode.  -Overnight EEG in the hospital in August, 2024 demonstrated right sided slowing.  MRI brain did not demonstrate structural etiology following this, although she has had old, small chronic right frontal infarct, noted on her 2022 MRI brain, and did not mention that on her 2024 MRI brain, although it was seen.  It is doubtful that this would have caused significant right sided slowing, however.  -I would recommend repeating the MRI of the brain with contrast, as that was not done since ligated 2 years ago, and they are reporting that the EEG showed slowing on the right.  One cannot really rule out structural abnormality without contrast.  -Discussed that we can go ahead and put her on seizure medicine and just see how she does.  If that does not help, then I would certainly recommend that she have a cardiac workup. Subjective:   Karen Nguyen was seen today in follow-up.  Pt with husband  who supplements the history.  She was last seen in May.  At that point in time, we just slightly increased her levodopa, primarily  so that she did not have to cut it in half.  Patient was in the hospital mid to end of August.  Notes are reviewed.  She presented with multiple syncopal episodes.  This occurred over a several months time span.  She was having episodes where she would feel nauseated (sometimes with emesis) and then would have a syncopal episode.  Following the episode, she would be very tired and might sleep for 4 to 6 hours.  There was no known loss of bladder or bowel control during the episode, but she is incontinent at baseline of bladder.  EEG demonstrated asymmetric slowing on the right.  No evidence of epileptiform activity.  Imaging of the brain was done on, both CT and MRI, but both were the without contrast.  Neither of these demonstrated a structural abnormality.  Current movement disorder medications: Carbidopa/levodopa 25/100, 2 tablets 3 times per day (increase) Carbidopa/levodopa 50/200 at bedtime  ALLERGIES:   Allergies  Allergen Reactions   Codeine Phosphate [Codeine] Nausea And Vomiting   Latex Hives   Sulfur Rash   Lyrica Cr [Pregabalin Er]     Unknown reaction   Oxycodone Rash    Rash on roof of mouth    CURRENT MEDICATIONS:  Current Outpatient Medications  Medication Instructions   acetaminophen (TYLENOL) 650 mg, Oral, Every 6 hours PRN   AMBULATORY NON FORMULARY MEDICATION Walker:  2 wheels, 2 tennis balls<BR>Dx: G20   aspirin EC 81 mg, Oral, Daily   Calcium Carb-Cholecalciferol (OS-CAL EXTRA D3) 500-600 MG-UNIT TABS Oral   carbidopa-levodopa (  SINEMET CR) 50-200 MG tablet 1 tablet, Oral, Daily at bedtime   carbidopa-levodopa (SINEMET IR) 25-100 MG tablet TAKE 1 & 1/2 TABLETS BY MOUTH AT 7AM, 11AM & 4PM   losartan (COZAAR) 25 mg, Oral, Daily   Multiple Vitamins-Minerals (EMERGEN-C VITAMIN C) PACK 1 packet, Oral, Daily   ondansetron (ZOFRAN) 4 mg, Oral, Every 6 hours PRN   pravastatin (PRAVACHOL) 20 mg, Oral, Daily, Take 1 tablet by mouth every day for cholesterol    Objective:    PHYSICAL EXAMINATION:    VITALS:   There were no vitals filed for this visit.   GEN:  The patient appears stated age and is in NAD. HEENT:  Normocephalic, atraumatic.  The mucous membranes are moist. The superficial temporal arteries are without ropiness or tenderness. CV:  RRR Lungs:  CTAB Neck/HEME:  There are no carotid bruits bilaterally. Musculoskeletal: The trunk is bent left.  This is stable from previous.  Neurological examination:  Orientation: The patient is alert and oriented x3.   Cranial nerves: There is good facial symmetry.  Extraocular muscles are intact but she has significant trouble with smooth pursuit. The visual fields are full to confrontational testing. The speech is fluent and clear. Soft palate rises symmetrically and there is no tongue deviation. Hearing is intact to conversational tone. Motor: Strength is 5/5 in the bilateral upper and right lower extremities.   Shoulder shrug is equal and symmetric.  There is no pronator drift.  Movement examination: Tone: There is normal tone in the bilateral upper extremities.  The tone in the lower extremities is normal.  Abnormal movements: She has choreiform movements, mostly on the left, but occasionally on the right. Coordination:  There is significant slowness on the L.   Gait and Station: She is apraxic with the R arm getting OOC.  Once out, she ambulates cautiously with the walker but has scoliosis to the L.    Total time spent on today's visit was  *** minutes, including both face-to-face time and nonface-to-face time.  Time included that spent on review of records (prior notes available to me/labs/imaging if pertinent), discussing treatment and goals, answering patient's questions and coordinating care.  Cc:  Verdell Face, DO

## 2023-03-16 ENCOUNTER — Telehealth: Payer: Self-pay | Admitting: Neurology

## 2023-03-16 NOTE — Telephone Encounter (Signed)
Called and spoke to patients daughter and informed her that Dr. Arbutus Leas was sorry to hear that they are struggling. The PCP should be able to help the [facility] form if they are aware of where they want to go. Informed ronnie that we unfortunately don't have anyone in our office to help with SNF transition. I did remind patients daughter to find some facilities they would like patient to go to. Patients daughter thanked me for the call and had no further questions or concerns.

## 2023-03-16 NOTE — Telephone Encounter (Signed)
Patients Daughter called stating that the patient has declined in self care, husband has hurt himself and now is having to have knee surgery and be put in rehab for it. Patient is needing 24/7 care, not eating well. Showing Dementia signs and not sleeping which is causing husband to not sleep, Daughter said she is very irritable . They are looking for help to get patient into a nursing home

## 2023-03-16 NOTE — Telephone Encounter (Signed)
I advised to contact PCP for her FL2 form, he thanked me for calling her husband whom Is on the [facility]. Ian Bushman

## 2023-04-06 ENCOUNTER — Other Ambulatory Visit: Payer: Self-pay | Admitting: Neurology

## 2023-04-06 DIAGNOSIS — G255 Other chorea: Secondary | ICD-10-CM

## 2023-04-06 DIAGNOSIS — G3185 Corticobasal degeneration: Secondary | ICD-10-CM

## 2023-04-06 DIAGNOSIS — R251 Tremor, unspecified: Secondary | ICD-10-CM

## 2023-04-06 DIAGNOSIS — R270 Ataxia, unspecified: Secondary | ICD-10-CM

## 2023-05-05 NOTE — Progress Notes (Unsigned)
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   -DaTscan positive, with decreased radiotracer activity in the right stratum (consistent with symptoms).   -Genetic testing for HD negative.  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  -increase carbidopa/levodopa 25/100, 2 tablet 3 times per day  -Continue carbidopa/levodopa 50/200 CR at bed.  Having trouble getting paid via [employer] so information given on [facility] -skin biopsy declined -[id number] testing declined via [employer]  2.  Multiple confusional episodes with syncope  -given R sided slowing on EEG, will go ahead and start keppra 500 mg bid.  Will let us know if SE, including behavioral change. Subjective:   Karen Nguyen was seen today in follow-up.  Pt with husband  who supplements the history.  We increased her levodopa dose last visit, but that was just so she did not have to split the tablets in half any longer.  She is doing well with that.  She continues with rock steady boxing.  Patient was in the emergency room in June after a fall.  Emergency room workup demonstrated age-indeterminate compression fracture of the T9 vertebral body.  She was discharged from the emergency room.  She was back in the emergency room and admitted for "seizure-like spells."  The spells are described as getting dizzy and nauseated and sometimes vomiting and then losing consciousness and then being sleepy for 4 to 6 hours thereafter.  They are unaware of urinary incontinence as she wears diapers and is incontinent at baseline.  Initial and repeat EEG demonstrating slowing over the right hemisphere but no epileptiform activity.  MRI brain without contrast was negative.  The neurologist in the hospital, Dr. Clent Jacks, felt that the episodes could just represent  cognitive fluctuations and therefore he felt she may have Lewy body disease.  She ended up being discharged home.  She became progressively more difficult to take care of.  She ended up back in the hospital at [facility] in mid November for back and trunk pain.  She had had some falls.  She was discharged to SNF  Current movement disorder medications: Carbidopa/levodopa 25/100, 2 tablet 3 times daily (increase) Carbidopa/levodopa 50/200 at bedtime   ALLERGIES:   Allergies  Allergen Reactions   Codeine Phosphate [Codeine] Nausea And Vomiting   Latex Hives   Sulfur Rash   Lyrica Cr [Pregabalin Er]     Unknown reaction   Oxycodone Rash    Rash on roof of mouth    CURRENT MEDICATIONS:  Current Outpatient Medications  Medication Instructions   acetaminophen (TYLENOL) 650 mg, Oral, Every 6 hours PRN   AMBULATORY NON FORMULARY MEDICATION Walker:  2 wheels, 2 tennis balls<BR>Dx: G20   aspirin EC 81 mg, Oral, Daily   Calcium Carb-Cholecalciferol (OS-CAL EXTRA D3) 500-600 MG-UNIT TABS Oral   carbidopa-levodopa (SINEMET CR) 50-200 MG tablet 1 tablet, Oral, Daily at bedtime   carbidopa-levodopa (SINEMET IR) 25-100 MG tablet 2 tablets, Oral, 3 times daily   losartan (COZAAR) 25 mg, Oral, Daily   Multiple Vitamins-Minerals (EMERGEN-C VITAMIN C) PACK 1 packet, Oral, Daily   ondansetron (ZOFRAN) 4 mg, Oral, Every 6 hours PRN   pravastatin (PRAVACHOL) 20 mg, Oral, Daily, Take 1 tablet by mouth every day for cholesterol    Objective:   PHYSICAL EXAMINATION:  VITALS:   There were no vitals filed for this visit.   GEN:  The patient appears stated age and is in NAD. HEENT:  Normocephalic, atraumatic.  The mucous membranes are moist. The superficial temporal arteries are without ropiness or tenderness. CV:  RRR Lungs:  CTAB Neck/HEME:  There are no carotid bruits bilaterally. Musculoskeletal: The trunk is bent left.  This is stable from previous.  Neurological examination:  Orientation: The  patient is alert and oriented x3.   Cranial nerves: There is good facial symmetry.  Extraocular muscles are intact but she has significant trouble with smooth pursuit. The visual fields are full to confrontational testing. The speech is fluent and clear. Soft palate rises symmetrically and there is no tongue deviation. Hearing is intact to conversational tone. Motor: Strength is 5/5 in the bilateral upper and right lower extremities.   Shoulder shrug is equal and symmetric.  There is no pronator drift.  Movement examination: Tone: There is normal tone in the bilateral upper extremities.  The tone in the lower extremities is normal.  Abnormal movements: She has choreiform movements, mostly on the left, but occasionally on the right. Coordination:  There is significant slowness on the L.   Gait and Station: She is apraxic with the R arm getting OOC.  Once out, she ambulates cautiously with the walker but has scoliosis to the L.    Total time spent on today's visit was  *** minutes, including both face-to-face time and nonface-to-face time.  Time included that spent on review of records (prior notes available to me/labs/imaging if pertinent), discussing treatment and goals, answering patient's questions and coordinating care.  Cc:  Verdell Face, DO

## 2023-05-07 ENCOUNTER — Ambulatory Visit: Payer: PRIVATE HEALTH INSURANCE | Admitting: Neurology

## 2023-05-08 ENCOUNTER — Encounter: Payer: Self-pay | Admitting: Neurology

## 2023-05-08 ENCOUNTER — Ambulatory Visit: Payer: Medicare HMO | Admitting: Neurology

## 2023-07-07 ENCOUNTER — Other Ambulatory Visit: Payer: Self-pay | Admitting: Neurology

## 2023-07-07 DIAGNOSIS — R270 Ataxia, unspecified: Secondary | ICD-10-CM

## 2023-07-07 DIAGNOSIS — G3185 Corticobasal degeneration: Secondary | ICD-10-CM

## 2023-07-07 DIAGNOSIS — G255 Other chorea: Secondary | ICD-10-CM

## 2023-07-07 DIAGNOSIS — R251 Tremor, unspecified: Secondary | ICD-10-CM

## 2023-08-01 DEATH — deceased

## 2023-09-01 DEATH — deceased

## 2023-09-14 ENCOUNTER — Telehealth: Payer: Self-pay | Admitting: Neurology

## 2023-09-14 NOTE — Telephone Encounter (Signed)
 Spoke with the pt's spouse about an upcoming appointment and was told the pt passed away 2023/08/01

## 2023-09-16 ENCOUNTER — Ambulatory Visit: Payer: Medicare HMO | Admitting: Neurology
